# Patient Record
Sex: Male | Born: 1970 | Race: White | Hispanic: No | Smoking: Former smoker
Health system: Southern US, Community
[De-identification: ages and names within clinical notes are randomized; demographics above are authoritative.]

## PROBLEM LIST (undated history)

## (undated) DIAGNOSIS — F329 Major depressive disorder, single episode, unspecified: Secondary | ICD-10-CM

## (undated) DIAGNOSIS — F32A Depression, unspecified: Secondary | ICD-10-CM

## (undated) DIAGNOSIS — F419 Anxiety disorder, unspecified: Secondary | ICD-10-CM

## (undated) DIAGNOSIS — K219 Gastro-esophageal reflux disease without esophagitis: Secondary | ICD-10-CM

## (undated) HISTORY — DX: Anxiety disorder, unspecified: F41.9

## (undated) HISTORY — DX: Gastro-esophageal reflux disease without esophagitis: K21.9

## (undated) HISTORY — DX: Major depressive disorder, single episode, unspecified: F32.9

## (undated) HISTORY — DX: Depression, unspecified: F32.A

---

## 2012-07-18 HISTORY — PX: VASECTOMY: SHX75

## 2013-11-28 ENCOUNTER — Encounter (INDEPENDENT_AMBULATORY_CARE_PROVIDER_SITE_OTHER): Payer: Self-pay

## 2013-11-28 ENCOUNTER — Ambulatory Visit (INDEPENDENT_AMBULATORY_CARE_PROVIDER_SITE_OTHER): Payer: 59 | Admitting: Psychiatry

## 2013-11-28 ENCOUNTER — Encounter (HOSPITAL_COMMUNITY): Payer: Self-pay | Admitting: Psychiatry

## 2013-11-28 VITALS — BP 125/83 | HR 74 | Ht 71.0 in | Wt 200.0 lb

## 2013-11-28 DIAGNOSIS — F321 Major depressive disorder, single episode, moderate: Secondary | ICD-10-CM | POA: Insufficient documentation

## 2013-11-28 DIAGNOSIS — F329 Major depressive disorder, single episode, unspecified: Secondary | ICD-10-CM

## 2013-11-28 MED ORDER — BUPROPION HCL ER (XL) 150 MG PO TB24: 150.0000 mg | ORAL_TABLET | Freq: Every day | ORAL | Status: DC
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
## 2014-01-28 ENCOUNTER — Other Ambulatory Visit (HOSPITAL_COMMUNITY): Payer: Self-pay | Admitting: Psychiatry

## 2014-01-28 ENCOUNTER — Ambulatory Visit (HOSPITAL_COMMUNITY): Payer: 59 | Admitting: Psychiatry

## 2014-01-30 ENCOUNTER — Encounter (HOSPITAL_COMMUNITY): Payer: Self-pay | Admitting: Psychiatry

## 2014-01-30 ENCOUNTER — Ambulatory Visit (HOSPITAL_COMMUNITY): Payer: 59 | Admitting: Psychiatry

## 2014-01-30 ENCOUNTER — Ambulatory Visit (INDEPENDENT_AMBULATORY_CARE_PROVIDER_SITE_OTHER): Payer: 59 | Admitting: Psychiatry

## 2014-01-30 VITALS — BP 116/76 | HR 75 | Ht 71.0 in | Wt 192.8 lb

## 2014-01-30 DIAGNOSIS — F321 Major depressive disorder, single episode, moderate: Secondary | ICD-10-CM

## 2014-01-30 MED ORDER — BUPROPION HCL ER (XL) 150 MG PO TB24: 150.0000 mg | ORAL_TABLET | Freq: Every day | ORAL | Status: DC

## 2014-01-30 NOTE — Progress Notes (Signed)
[facility] [id number] Progress Note  Thomas Kelly [id number] 43 y.o.  01/30/2014 1:37 PM  Chief Complaint: doing well  History of Present Illness: Still has a few moments where he feels down 1-2x/month. Overall he feels better. States it occurs in the morning when he thinking about things he needs to do.  Depression is improved and less intense. Motivation and anhedonia have improved. Denies irritability. Sleep is good for the most part. Appetite, energy and concentration are good.   Anxiety is present but manageable.    Pt takes Wellbutrin as prescribed and denies SE.    School is starting again in late August. Pt is looking forward to it.   Suicidal Ideation: No Plan Formed: No Patient has means to carry out plan: No  Homicidal Ideation: No Plan Formed: No Patient has means to carry out plan: No  Review of Systems: Psychiatric: Agitation: No Hallucination: No Depressed Mood: No Insomnia: No Hypersomnia: No Altered Concentration: No Feels Worthless: No Grandiose Ideas: No Belief In Special Powers: No New/Increased Substance Abuse: No Compulsions: No  Neurologic: Headache: No Seizure: No Paresthesias: No  Past Medical Family, Social History:  Denies current nicotine and drug use. He drinks beer a few times a week. [place]. He has 2 sons who he shares custody with his ex- wife. 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 He enjoys painting, music, poetry and bike riding. Pt endorsing family hx of ADD and Bipolar disorder in his son.   Outpatient Encounter Prescriptions as of 01/30/2014  Medication Sig  . buPROPion (WELLBUTRIN XL) 150 MG 24 hr tablet Take 1 tablet (150 mg total) by mouth daily.  . Fish Oil OIL Take 1 tablet by mouth daily.  . Multiple Vitamin (MULTIVITAMIN) capsule Take 1 capsule by mouth daily.  . Omeprazole 20 MG TBEC Take 20-40 mg by mouth daily.    Past Psychiatric History/Hospitalization(s): Anxiety: No Bipolar  Disorder: No Depression: Yes Mania: No Psychosis: No Schizophrenia: No Personality Disorder: No Hospitalization for psychiatric illness: No History of Electroconvulsive Shock Therapy: No Prior Suicide Attempts: No  Physical Exam: Constitutional:  BP 116/76  Pulse 75  Ht 5\' 11"  (1.803 m)  Wt 192 lb 12.8 oz (87.454 kg)  BMI 26.90 kg/m2  General Appearance: alert, oriented, no acute distress  Musculoskeletal: Strength & Muscle Tone: within normal limits Gait & Station: normal Patient leans: N/A  Mental Status Examination/Evaluation:  Objective: Appearance: fairly groomed, appears to be stated age  Attitude: Calm and cooperative  Eye Contact: Fair   Speech and Language: Clear and Coherent, spontaneous, normal rate  Volume: Normal   Mood: euthymic  Affect: Full Range   Thought Process: Coherent   Attention/Concentration: WNL  Orientation: Full (Time, Place, and Person)   Thought Content: WDL  Suicidal Thoughts: No  Homicidal Thoughts: No   Judgement: Fair   General fund of knowledge: average  Insight: Present   Psychomotor Activity: Normal   Akathisia: No   Handed: Right   AIMS (if indicated): Facial and Oral Movements  Muscles of Facial Expression: None, normal  Lips and Perioral Area: None, normal  Jaw: None, normal  Tongue: None, normal Extremity Movements: Upper (arms, wrists, hands, fingers): None, normal  Lower (legs, knees, ankles, toes): None, normal,  Trunk Movements:  Neck, shoulders, hips: None, normal,  Overall Severity : Severity of abnormal movements (highest score from questions above): None, normal  Incapacitation due to abnormal movements: None, normal  Patient's awareness of abnormal movements (rate  only patient's report): No Awareness, Dental Status  Current problems with teeth and/or dentures?: No  Does patient usually wear dentures?: No       Medical Decision Making (Choose Three): Established Problem, Stable/Improving (1), Review of  Psycho-Social Stressors (1) and Review of Medication Regimen & Side Effects (2)  Assessment: AXIS I  Major Depression, single episode   AXIS II  Deferred   AXIS III  Past Medical History    Diagnosis  Date    .  GERD (gastroesophageal reflux disease)    AXIS IV  occupational problems and other psychosocial or environmental problems   AXIS V  51-60 moderate symptoms     Treatment Plan/Recommendations:  Plan of Care:  Medication management with supportive therapy. Risks/benefits and SE of the medication discussed. Pt verbalized understanding and verbal consent obtained for treatment.  Affirm with the patient that the medications are taken as ordered. Patient expressed understanding of how their medications were to be used.     Laboratory: pt will fax over labs from PCP, done in March 2015   Psychotherapy: Therapy: brief supportive therapy provided. Discussed psychosocial stressors in detail.    continue therapy with psychologist   Medications: Continue Wellbutrin XL 150mg  for mood. Consider folic acid and Sam-E supplements as it may benefit mood   Routine PRN Medications: No   Consultations: none at this time   Safety Concerns: Pt denies SI and is at an acute low risk for suicide. Crisis plan reviewed and discussed. Pt verbalized understanding.   Other: F/up in 3 months or sooner if needed    Oletta Darter, MD 01/30/2014

## 2014-04-25 ENCOUNTER — Other Ambulatory Visit (HOSPITAL_COMMUNITY): Payer: Self-pay | Admitting: Psychiatry

## 2014-05-13 ENCOUNTER — Ambulatory Visit (INDEPENDENT_AMBULATORY_CARE_PROVIDER_SITE_OTHER): Payer: 59 | Admitting: Psychiatry

## 2014-05-13 ENCOUNTER — Encounter (HOSPITAL_COMMUNITY): Payer: Self-pay | Admitting: Psychiatry

## 2014-05-13 VITALS — BP 131/83 | HR 64 | Ht 71.0 in | Wt 191.4 lb

## 2014-05-13 DIAGNOSIS — F321 Major depressive disorder, single episode, moderate: Secondary | ICD-10-CM

## 2014-05-13 MED ORDER — BUPROPION HCL ER (XL) 150 MG PO TB24: 150.0000 mg | ORAL_TABLET | Freq: Every day | ORAL | Status: DC

## 2014-05-13 NOTE — Progress Notes (Signed)
[facility] [id number] Progress Note  Thomas Kelly [id number] 43 y.o.  05/13/2014 11:11 AM  Chief Complaint: feeling stressed  History of Present Illness: Pt just got back from an isolative 2 week research trip. States he usually doesn't handle being alone for long but did well overall.  Depression is significantly improved. Has rare moments of feeling a little down but overall it is managable. One bad episode when he went camping alone for 2 nights after his dog died. He was very depressed and was worried about having SI (he did not have any SI). Pt spent his time writing and thought about his work. Pt denies hopelessness, worthlessness, isolation and anhedonia. Work is going well but he has a lot of stress and is overwhelmed. Denies irritability. Sleep is better but still a little variable. Appetite, energy and concentration are good. He is exercising, using meditation and eating well.   Anxiety is present but moderate and manageable.  Overall he is handling conferences and talks better.   Pt takes Wellbutrin as prescribed and denies SE.    Suicidal Ideation: No Plan Formed: No Patient has means to carry out plan: No  Homicidal Ideation: No Plan Formed: No Patient has means to carry out plan: No  Review of Systems: Psychiatric: Agitation: No Hallucination: No Depressed Mood: No Insomnia: No Hypersomnia: No Altered Concentration: No Feels Worthless: No Grandiose Ideas: No Belief In Special Powers: No New/Increased Substance Abuse: No Compulsions: No  Neurologic: Headache: No Seizure: No Paresthesias: No  Review of Systems  Constitutional: Negative for fever, chills and malaise/fatigue.  HENT: Negative for congestion, nosebleeds and sore throat.   Eyes: Negative for blurred vision and double vision.  Respiratory: Negative for cough and sputum production.   Cardiovascular: Negative for chest pain, palpitations and leg swelling.  Gastrointestinal: Positive for  heartburn. Negative for nausea, vomiting, abdominal pain, diarrhea and constipation.  Musculoskeletal: Negative for back pain, joint pain and neck pain.  Neurological: Negative for dizziness, tremors, sensory change, focal weakness, seizures and headaches.  Psychiatric/Behavioral: Negative for depression, suicidal ideas, hallucinations and substance abuse. The patient is nervous/anxious. The patient does not have insomnia.      Past Medical Family, Social History:  Denies current nicotine and drug use. He drinks beer a few times a week. [place]. He has 2 sons who he shares custody with his ex- wife. 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 Pt endorsing family hx of ADD and Bipolar disorder in his son.  Past Medical History  Diagnosis Date  . GERD (gastroesophageal reflux disease)     Outpatient Encounter Prescriptions as of 05/13/2014  Medication Sig  . buPROPion (WELLBUTRIN XL) 150 MG 24 hr tablet TAKE 1 TABLET ONCE DAILY.  Marland Kitchen. Fish Oil OIL Take 1 tablet by mouth daily.  . Multiple Vitamin (MULTIVITAMIN) capsule Take 1 capsule by mouth daily.  . Omeprazole 20 MG TBEC Take 20-40 mg by mouth daily.    Past Psychiatric History/Hospitalization(s): Anxiety: No Bipolar Disorder: No Depression: Yes Mania: No Psychosis: No Schizophrenia: No Personality Disorder: No Hospitalization for psychiatric illness: No History of Electroconvulsive Shock Therapy: No Prior Suicide Attempts: No  Physical Exam: Constitutional:  BP 131/83  Pulse 64  Ht 5\' 11"  (1.803 m)  Wt 191 lb 6.4 oz (86.818 kg)  BMI 26.71 kg/m2  General Appearance: alert, oriented, no acute distress  Musculoskeletal: Strength & Muscle Tone: within normal limits Gait & Station: normal  Patient leans: N/A  Mental Status Examination/Evaluation: Objective: Attitude: Calm and cooperative  Appearance: Well Groomed, appears to be stated age  Eye  Contact::  Good  Speech:  Clear and Coherent and Normal Rate  Volume:  Normal  Mood:  euthymic  Affect:  Full Range  Thought Process:  Goal Directed, Linear and Logical  Orientation:  Full (Time, Place, and Person)  Thought Content:  Negative  Suicidal Thoughts:  No  Homicidal Thoughts:  No  Judgement:  Good  Insight:  Good  Concentration: good  Memory: Immediate-good Recent-good Remote-good  Recall: fair  Language: fair  Gait and Station: normal  [employer] of Knowledge: average  Psychomotor Activity:  Normal  Akathisia:  No  Handed:  Right  AIMS (if indicated): n/a  Assets:  Manufacturing systems engineerCommunication Skills Desire for Improvement Financial Resources/Insurance Housing Intimacy Leisure Time Physical Health Resilience Social Support Water engineerTalents/Skills Transportation     Medical Decision Making (Choose Three): Established Problem, Stable/Improving (1), Review of Psycho-Social Stressors (1) and Review of Medication Regimen & Side Effects (2)  Assessment: AXIS I  Major Depression, single episode, moderate  AXIS II  Deferred   AXIS III  Past Medical History    Diagnosis  Date    .  GERD (gastroesophageal reflux disease)    AXIS IV  occupational problems and other psychosocial or environmental problems   AXIS V  51-60 moderate symptoms     Treatment Plan/Recommendations:  Plan of Care:   Medication management with supportive therapy. Risks/benefits and SE of the medication discussed. Pt verbalized understanding and verbal consent obtained for treatment.  Affirm with the patient that the medications are taken as ordered. Patient expressed understanding of how their medications were to be used.  - improvement of symptoms       Laboratory: pt will sign MR to get labs from PCP, done in March 2015   Psychotherapy: Therapy: brief supportive therapy provided. Discussed psychosocial stressors in detail.     Medications: Continue Wellbutrin XL 150mg  for mood.  Routine PRN Medications:  No   Consultations: encouraged to continue individual therapy  Safety Concerns: Pt denies SI and is at an acute low risk for suicide.Patient told to call clinic if any problems occur. Patient advised to go to ER if they should develop SI/HI, side effects, or if symptoms worsen. Has crisis numbers to call if needed. Pt verbalized understanding.   Other: F/up in 3 months or sooner if needed    Oletta DarterAGARWAL, Isaic Syler, MD 05/13/2014

## 2014-07-31 ENCOUNTER — Ambulatory Visit (INDEPENDENT_AMBULATORY_CARE_PROVIDER_SITE_OTHER): Payer: 59 | Admitting: Psychiatry

## 2014-07-31 ENCOUNTER — Encounter (HOSPITAL_COMMUNITY): Payer: Self-pay | Admitting: Psychiatry

## 2014-07-31 VITALS — BP 129/81 | HR 69 | Ht 70.0 in | Wt 192.4 lb

## 2014-07-31 DIAGNOSIS — F321 Major depressive disorder, single episode, moderate: Secondary | ICD-10-CM

## 2014-07-31 MED ORDER — BUPROPION HCL ER (XL) 300 MG PO TB24: 300.0000 mg | ORAL_TABLET | Freq: Every day | ORAL | Status: DC

## 2014-07-31 NOTE — Progress Notes (Signed)
[facility] [id number] Progress Note  Thomas Kelly [id number] 44 y.o.  07/31/2014 11:00 AM  Chief Complaint: overall ok  History of Present Illness: Pt is on holiday until Jan 25th. He states it is giving him a lot of time to think.   He writes in a journal more so when stressed. Notes he is more stressed when the semester begins and ends each year.  Depression is present and level is moderate. Holidays are always difficult. Always feels the need to discuss his feelings with others but is working on doing that less. He is working on using new Systems analyst. Sad mood frequency depends on his stress level.  Pt denies hopelessness, worthlessness, isolation and anhedonia. He enjoys socializing but it feels like a chore. He is looking forward to the new semester. Sleep is good. Appetite, energy and concentration are good. He is exercising, using meditation and eating well.   Anxiety is present and he is working.   Pt takes Wellbutrin as prescribed and denies SE.    Continues to work with therapist.   Suicidal Ideation: No Plan Formed: No Patient has means to carry out plan: No  Homicidal Ideation: No Plan Formed: No Patient has means to carry out plan: No  Review of Systems: Psychiatric: Agitation: No Hallucination: No Depressed Mood: Yes Insomnia: No Hypersomnia: No Altered Concentration: No Feels Worthless: No Grandiose Ideas: No Belief In Special Powers: No New/Increased Substance Abuse: No Compulsions: No  Neurologic: Headache: No Seizure: No Paresthesias: No  Review of Systems  Constitutional: Negative for fever, chills and weight loss.  HENT: Negative for ear discharge, nosebleeds and sore throat.   Eyes: Negative for blurred vision, double vision and redness.  Respiratory: Negative for cough, sputum production and shortness of breath.   Cardiovascular: Positive for leg swelling. Negative for chest pain and palpitations.  Gastrointestinal: Positive for  heartburn. Negative for nausea, vomiting and abdominal pain.  Musculoskeletal: Negative for myalgias, back pain and neck pain.  Skin: Negative for itching and rash.  Neurological: Negative for dizziness, tingling, seizures and headaches.  Psychiatric/Behavioral: Positive for depression. Negative for suicidal ideas, hallucinations and substance abuse. The patient is nervous/anxious. The patient does not have insomnia.      Past Medical Family, Social History:  Denies current nicotine and drug use. He drinks beer a few times a week. Pt lives with his girlfriend in [place]. He has 2 sons who he shares custody with his ex- wife. 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 He enjoys painting, music, poetry and bike riding. Pt endorsing family hx of ADD and Bipolar disorder in his son.  Past Medical History  Diagnosis Date  . GERD (gastroesophageal reflux disease)     Outpatient Encounter Prescriptions as of 07/31/2014  Medication Sig  . buPROPion (WELLBUTRIN XL) 150 MG 24 hr tablet Take 1 tablet (150 mg total) by mouth daily.  . Fish Oil OIL Take 1 tablet by mouth daily.  . Multiple Vitamin (MULTIVITAMIN) capsule Take 1 capsule by mouth daily.  . Omeprazole 20 MG TBEC Take 20-40 mg by mouth daily.    Past Psychiatric History/Hospitalization(s): Anxiety: No Bipolar Disorder: No Depression: Yes Mania: No Psychosis: No Schizophrenia: No Personality Disorder: No Hospitalization for psychiatric illness: No History of Electroconvulsive Shock Therapy: No Prior Suicide Attempts: No  Physical Exam: Constitutional:  BP 129/81 mmHg  Pulse 69  Ht  (1.778 m)  Wt 192 lb 6.4 oz (87.272 kg)  BMI 27.61 kg/m2  General Appearance: alert, oriented, no  acute distress  Musculoskeletal: Strength & Muscle Tone: within normal limits Gait & Station: normal Patient leans: N/A  Mental Status Examination/Evaluation: Objective: Attitude: Calm and cooperative  Appearance: Well Groomed, appears to be  stated age  Eye Contact::  Good  Speech:  Clear and Coherent and Normal Rate  Volume:  Normal  Mood:  depressed  Affect:  Congruent  Thought Process:  Goal Directed, Linear and Logical  Orientation:  Full (Time, Place, and Person)  Thought Content:  Negative  Suicidal Thoughts:  No  Homicidal Thoughts:  No  Judgement:  Good  Insight:  Good  Concentration: good  Memory: Immediate-good Recent-good Remote-good  Recall: fair  Language: fair  Gait and Station: normal  [employer] of Knowledge: average  Psychomotor Activity:  Normal  Akathisia:  No  Handed:  Right  AIMS (if indicated): n/a  Assets:  Manufacturing systems engineerCommunication Skills Desire for Improvement Financial Resources/Insurance Housing Intimacy Leisure Time Physical Health Resilience Social Support Water engineerTalents/Skills Transportation     Medical Decision Making (Choose Three): Review of Psycho-Social Stressors (1), Established Problem, Worsening (2), Review of Medication Regimen & Side Effects (2) and Review of New Medication or Change in Dosage (2)  Assessment: AXIS I  Major Depression, single episode, moderate  AXIS II  Deferred   AXIS III  Past Medical History    Diagnosis  Date    .  GERD (gastroesophageal reflux disease)    AXIS IV  occupational problems and other psychosocial or environmental problems   AXIS V  51-60 moderate symptoms     Treatment Plan/Recommendations:  Plan of Care:   Medication management with supportive therapy. Risks/benefits and SE of the medication discussed. Pt verbalized understanding and verbal consent obtained for treatment.  Affirm with the patient that the medications are taken as ordered. Patient expressed understanding of how their medications were to be used.  - worsening of symptoms   Laboratory: pt will sign MR to get labs from PCP  Psychotherapy: Therapy: brief supportive therapy provided. Discussed psychosocial stressors in detail.     Medications: increase Wellbutrin XL to 300mg  for  mood. Advised to consider supplement with Sam-E  Routine PRN Medications: No   Consultations: encouraged to continue individual therapy  Safety Concerns: Pt denies SI and is at an acute low risk for suicide.Patient told to call clinic if any problems occur. Patient advised to go to ER if they should develop SI/HI, side effects, or if symptoms worsen. Has crisis numbers to call if needed. Pt verbalized understanding.   Other: F/up in 3 months or sooner if needed    Oletta DarterAGARWAL, Quan Cybulski, MD 07/31/2014

## 2014-10-28 ENCOUNTER — Encounter (HOSPITAL_COMMUNITY): Payer: Self-pay | Admitting: Psychiatry

## 2014-10-28 ENCOUNTER — Ambulatory Visit (INDEPENDENT_AMBULATORY_CARE_PROVIDER_SITE_OTHER): Payer: 59 | Admitting: Psychiatry

## 2014-10-28 VITALS — BP 129/79 | HR 77 | Ht 70.0 in | Wt 194.6 lb

## 2014-10-28 DIAGNOSIS — F321 Major depressive disorder, single episode, moderate: Secondary | ICD-10-CM

## 2014-10-28 MED ORDER — BUPROPION HCL ER (XL) 300 MG PO TB24: 300.0000 mg | ORAL_TABLET | Freq: Every day | ORAL | Status: DC

## 2014-10-28 NOTE — Progress Notes (Signed)
Patient ID: Thomas Kelly, male   DOB: 03-23-1971, 44 y.o.   MRN: [id number]  [facility] [id number] Progress Note  Thomas Kelly [id number] 44 y.o.  10/28/2014 2:33 PM  Chief Complaint: "doing great"  History of Present Illness: He is feeling good and is happy with his response to Wellbutrin.   He writes in a journal more so when stressed. Notes he is more stressed when the semester begins and ends each year.  Depression is present and level is low. He feels down for a few hours several times a week. He is working on using new Systems analyst. Sad mood frequency depends on his stress level.  Pt denies hopelessness, worthlessness, isolation and anhedonia. He enjoys socializing. Sleep is good. Appetite and energy are good. Concentration is a little poor but he is managing. He is exercising, using meditation and eating well.   Anxiety is improving and he is working. Pt is working in out in therapy.   Pt takes Wellbutrin as prescribed and denies SE.    Continues to work with therapist.   This summer he going to [place] for a trip and taking a road trip.   Suicidal Ideation: No Plan Formed: No Patient has means to carry out plan: No  Homicidal Ideation: No Plan Formed: No Patient has means to carry out plan: No  Review of Systems: Psychiatric: Agitation: No Hallucination: No Depressed Mood: Yes Insomnia: No Hypersomnia: No Altered Concentration: No Feels Worthless: No Grandiose Ideas: No Belief In Special Powers: No New/Increased Substance Abuse: No Compulsions: No  Neurologic: Headache: No Seizure: No Paresthesias: No  Review of Systems  Constitutional: Negative for fever, chills and weight loss.  HENT: Positive for sore throat. Negative for congestion, ear pain and nosebleeds.   Eyes: Negative for blurred vision, double vision, photophobia and pain.  Respiratory: Negative for cough, shortness of breath and wheezing.   Cardiovascular: Negative for chest pain,  palpitations and leg swelling.  Gastrointestinal: Negative for heartburn, nausea, vomiting and abdominal pain.  Musculoskeletal: Negative for back pain, joint pain and neck pain.  Skin: Negative for itching and rash.  Neurological: Negative for dizziness, sensory change, seizures and loss of consciousness.  Psychiatric/Behavioral: Positive for depression. Negative for suicidal ideas, hallucinations and substance abuse. The patient is nervous/anxious. The patient does not have insomnia.      Past Medical Family, Social History:  Denies current nicotine and drug use. He drinks beer a few times a week. Pt lives with his girlfriend in [place]. He has 2 sons who he shares custody with his ex- wife. 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 He enjoys painting, music, poetry and bike riding. Pt endorsing family hx of ADD and Bipolar disorder in his son.  Past Medical History  Diagnosis Date  . GERD (gastroesophageal reflux disease)   . Depression   . Anxiety     Outpatient Encounter Prescriptions as of 10/28/2014  Medication Sig  . buPROPion (WELLBUTRIN XL) 300 MG 24 hr tablet Take 1 tablet (300 mg total) by mouth daily.  . Fish Oil OIL Take 1 tablet by mouth daily.  . Multiple Vitamin (MULTIVITAMIN) capsule Take 1 capsule by mouth daily.  . Omeprazole 20 MG TBEC Take 20-40 mg by mouth daily.    Past Psychiatric History/Hospitalization(s): Anxiety: No Bipolar Disorder: No Depression: Yes Mania: No Psychosis: No Schizophrenia: No Personality Disorder: No Hospitalization for psychiatric illness: No History of Electroconvulsive Shock Therapy: No Prior Suicide Attempts: No  Physical Exam: Constitutional:  BP 129/79 mmHg  Pulse 77  Ht 5\' 10"  (1.778 m)  Wt 194 lb 9.6 oz (88.27 kg)  BMI 27.92 kg/m2  General Appearance: alert, oriented, no acute distress  Musculoskeletal: Strength & Muscle Tone: within normal limits Gait & Station: normal Patient leans: N/A  Mental Status  Examination/Evaluation: Objective: Attitude: Calm and cooperative  Appearance: Well Groomed, appears to be stated age  Eye Contact::  Good  Speech:  Clear and Coherent and Normal Rate  Volume:  Normal  Mood:  Depressed- mild  Affect:  Congruent- appears brighter than at previous appt  Thought Process:  Goal Directed, Linear and Logical  Orientation:  Full (Time, Place, and Person)  Thought Content:  Negative  Suicidal Thoughts:  No  Homicidal Thoughts:  No  Judgement:  Good  Insight:  Good  Concentration: good  Memory: Immediate-good Recent-good Remote-good  Recall: fair  Language: fair  Gait and Station: normal  [employer] of Knowledge: average  Psychomotor Activity:  Normal  Akathisia:  No  Handed:  Right  AIMS (if indicated): n/a  Assets:  Manufacturing systems engineerCommunication Skills Desire for Improvement Financial Resources/Insurance Housing Intimacy Leisure Time Physical Health Resilience Social Support Water engineerTalents/Skills Transportation     Medical Decision Making (Choose Three): Established Problem, Stable/Improving (1), Review of Psycho-Social Stressors (1) and Review of Medication Regimen & Side Effects (2)  Assessment: AXIS I  Major Depression, single episode, moderate  AXIS II  Deferred   AXIS III  Past Medical History    Diagnosis  Date    .  GERD (gastroesophageal reflux disease)    AXIS IV  occupational problems and other psychosocial or environmental problems   AXIS V  51-60 moderate symptoms     Treatment Plan/Recommendations:  Plan of Care:   Medication management with supportive therapy. Risks/benefits and SE of the medication discussed. Pt verbalized understanding and verbal consent obtained for treatment.  Affirm with the patient that the medications are taken as ordered. Patient expressed understanding of how their medications were to be used.  - improvement of symptoms   Laboratory: pt will sign MR to get labs from PCP  Psychotherapy: Therapy: brief supportive  therapy provided. Discussed psychosocial stressors in detail.     Medications: Wellbutrin XL to 300mg  for mood. He was unable to tolerate the Sam-E  Routine PRN Medications: No   Consultations: encouraged to continue individual therapy  Safety Concerns: Pt denies SI and is at an acute low risk for suicide.Patient told to call clinic if any problems occur. Patient advised to go to ER if they should develop SI/HI, side effects, or if symptoms worsen. Has crisis numbers to call if needed. Pt verbalized understanding.   Other: F/up in 6 months or sooner if needed    Oletta DarterAGARWAL, Jenella Craigie, MD 10/28/2014

## 2015-04-30 ENCOUNTER — Ambulatory Visit (HOSPITAL_COMMUNITY): Payer: Self-pay | Admitting: Psychiatry

## 2015-05-14 ENCOUNTER — Other Ambulatory Visit (HOSPITAL_COMMUNITY): Payer: Self-pay | Admitting: Psychiatry

## 2015-05-21 ENCOUNTER — Ambulatory Visit (HOSPITAL_COMMUNITY): Payer: Self-pay | Admitting: Psychiatry

## 2015-05-26 ENCOUNTER — Encounter (HOSPITAL_COMMUNITY): Payer: Self-pay | Admitting: Psychiatry

## 2015-05-26 ENCOUNTER — Ambulatory Visit (INDEPENDENT_AMBULATORY_CARE_PROVIDER_SITE_OTHER): Payer: 59 | Admitting: Psychiatry

## 2015-05-26 VITALS — BP 129/81 | HR 71 | Ht 70.0 in | Wt 201.8 lb

## 2015-05-26 DIAGNOSIS — F320 Major depressive disorder, single episode, mild: Secondary | ICD-10-CM

## 2015-05-26 MED ORDER — BUPROPION HCL ER (XL) 300 MG PO TB24: 300.0000 mg | ORAL_TABLET | Freq: Every day | ORAL | Status: DC

## 2015-05-26 NOTE — Progress Notes (Signed)
[facility] [id number] Progress Note  Thomas DusDamon Kelly [id number] 44 y.o.  05/26/2015 4:18 PM  Chief Complaint: "doing great"  History of Present Illness: He is feeling good and is happy with his response to Wellbutrin. Many times has thought he didn't need it any longer but wants to wait for now.   He writes in a journal more so when stressed. He is doing it less often because it keeps him too much in his head. Notes he is more stressed when the semester begins and ends each year.  Depression is present and level is low. He feels down for a few hours a couple times a month. He is working on using new Systems analystcoping techniques. Sad mood frequency depends on his stress level.  Pt denies hopelessness, worthlessness, crying spells isolation and anhedonia. He enjoys socializing. Work is going well.   Sleep is good. Appetite and energy are good. Concentration is ok.  He is exercising, using meditation and eating well.   Anxiety is improving and he has no concerns. No longer catrosphoping. . Pt is working on it in therapy.   Pt takes Wellbutrin as prescribed and denies SE.    Continues to work with therapist.      Suicidal Ideation: No Plan Formed: No Patient has means to carry out plan: No  Homicidal Ideation: No Plan Formed: No Patient has means to carry out plan: No  Review of Systems: Psychiatric: Agitation: No Hallucination: No Depressed Mood: Yes Insomnia: No Hypersomnia: No Altered Concentration: No Feels Worthless: No Grandiose Ideas: No Belief In Special Powers: No New/Increased Substance Abuse: No Compulsions: No  Neurologic: Headache: No Seizure: No Paresthesias: No  Review of Systems  Constitutional: Negative for fever, chills and weight loss.  HENT: Negative for congestion, ear pain, nosebleeds and sore throat.   Eyes: Negative for blurred vision, double vision, photophobia and pain.  Respiratory: Negative for cough, shortness of breath and wheezing.    Cardiovascular: Negative for chest pain, palpitations and leg swelling.  Gastrointestinal: Negative for heartburn, nausea, vomiting and abdominal pain.  Musculoskeletal: Negative for back pain, joint pain and neck pain.  Skin: Negative for itching and rash.  Neurological: Negative for dizziness, sensory change, seizures and loss of consciousness.  Psychiatric/Behavioral: Positive for depression. Negative for suicidal ideas, hallucinations and substance abuse. The patient is not nervous/anxious and does not have insomnia.      Past Medical Family, Social History:  Denies current nicotine and drug use. He drinks beer a few times a week. [place] lives [place] girlfriend in [place]. He has 2 sons who he shares custody with his ex- wife. 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 Pt endorsing family hx of ADD and Bipolar disorder in his son.  Past Medical History  Diagnosis Date  . GERD (gastroesophageal reflux disease)   . Depression   . Anxiety     Outpatient Encounter Prescriptions as of 05/26/2015  Medication Sig  . buPROPion (WELLBUTRIN XL) 300 MG 24 hr tablet TAKE 1 TABLET ONCE DAILY.  Marland Kitchen. Fish Oil OIL Take 1 tablet by mouth daily.  . Multiple Vitamin (MULTIVITAMIN) capsule Take 1 capsule by mouth daily.  . Omeprazole 20 MG TBEC Take 20-40 mg by mouth daily.   No facility-administered encounter medications on file as of 05/26/2015.    Past Psychiatric History/Hospitalization(s): Anxiety: No Bipolar Disorder: No Depression: Yes Mania: No Psychosis: No Schizophrenia: No Personality Disorder: No Hospitalization for psychiatric illness: No History of  Electroconvulsive Shock Therapy: No Prior Suicide Attempts: No  Physical Exam: Constitutional:  BP 129/81 mmHg  Pulse 71  Ht  (1.778 m)  Wt 201 lb 12.8 oz (91.536 kg)  BMI 28.96 kg/m2  General Appearance: alert, oriented, no acute distress  Musculoskeletal: Strength & Muscle  Tone: within normal limits Gait & Station: normal Patient leans: N/A  Mental Status Examination/Evaluation: Objective: Attitude: Calm and cooperative  Appearance: Well Groomed, appears to be stated age  Eye Contact::  Good  Speech:  Clear and Coherent and Normal Rate  Volume:  Normal  Mood:  euthymic  Affect:  Congruent- appears brighter than at previous appt  Thought Process:  Goal Directed, Linear and Logical  Orientation:  Full (Time, Place, and Person)  Thought Content:  Negative  Suicidal Thoughts:  No  Homicidal Thoughts:  No  Judgement:  Good  Insight:  Good  Concentration: good  Memory: Immediate-good Recent-good Remote-good  Recall: fair  Language: fair  Gait and Station: normal  [employer] of Knowledge: average  Psychomotor Activity:  Normal  Akathisia:  No  Handed:  Right  AIMS (if indicated): n/a  Assets:  Manufacturing systems engineer Desire for Improvement Financial Resources/Insurance Housing Intimacy Leisure Time Physical Health Resilience Social Support Water engineer (Choose Three): Established Problem, Stable/Improving (1), Review of Psycho-Social Stressors (1) and Review of Medication Regimen & Side Effects (2)  Assessment: AXIS I  Major Depression, single episode, moderate  AXIS II  Deferred   AXIS III  Past Medical History    Diagnosis  Date    .  GERD (gastroesophageal reflux disease)    AXIS IV  occupational problems and other psychosocial or environmental problems   AXIS V  51-60 moderate symptoms     Treatment Plan/Recommendations:  Plan of Care:   Medication management with supportive therapy. Risks/benefits and SE of the medication discussed. Pt verbalized understanding and verbal consent obtained for treatment.  Affirm with the patient that the medications are taken as ordered. Patient expressed understanding of how their medications were to be used.  - improvement of symptoms   Laboratory:  none  Psychotherapy: Therapy: brief supportive therapy provided. Discussed psychosocial stressors in detail.     Medications: Wellbutrin XL to  for mood. He was unable to tolerate the Sam-E  Routine PRN Medications: No   Consultations: encouraged to continue individual therapy  Safety Concerns: Pt denies SI and is at an acute low risk for suicide.Patient told to call clinic if any problems occur. Patient advised to go to ER if they should develop SI/HI, side effects, or if symptoms worsen. Has crisis numbers to call if needed. Pt verbalized understanding.   Other: F/up in 4 months or sooner if needed    Oletta Darter, MD 05/26/2015

## 2015-09-24 ENCOUNTER — Ambulatory Visit (INDEPENDENT_AMBULATORY_CARE_PROVIDER_SITE_OTHER): Payer: 59 | Admitting: Psychiatry

## 2015-09-24 ENCOUNTER — Encounter (HOSPITAL_COMMUNITY): Payer: Self-pay | Admitting: Psychiatry

## 2015-09-24 VITALS — BP 122/74 | HR 65 | Ht 70.0 in | Wt 202.0 lb

## 2015-09-24 DIAGNOSIS — F320 Major depressive disorder, single episode, mild: Secondary | ICD-10-CM

## 2015-09-24 MED ORDER — BUPROPION HCL ER (XL) 300 MG PO TB24: 300.0000 mg | ORAL_TABLET | Freq: Every day | ORAL | Status: DC

## 2015-09-24 NOTE — Progress Notes (Signed)
Patient ID: Thomas Kelly, male   DOB: Apr 11, 1971, 45 y.o.   MRN: [id number]  [facility] [id number] Progress Note  Thomas Kelly [id number] [age] y.o.  09/24/2015 4:06 PM  Chief Complaint: "doing great, no change"  History of Present Illness: He is feeling good and is happy with his response to Wellbutrin. No concerns or issues today.   He continues to journal when needed but tries not to do it too much b/c he tends to think about the worst.  Notes he is more stressed when the semester begins and ends each year.  Depression is present and level is very low. He feels down for a few hours a couple times a month.  Sad mood frequency depends on his stress level.  Pt denies hopelessness, worthlessness, crying spells isolation and anhedonia. He enjoys socializing. Work is going well.   Sleep is good. Appetite and energy are good. Concentration is ok.  He is exercising, using meditation and eating well.   Anxiety is improving and he has no concerns. No longer catrosphoping. . Pt is working on it in therapy.   Pt takes Wellbutrin as prescribed and denies SE.    Continues to work with therapist about 1-2x/month.    Suicidal Ideation: No Plan Formed: No Patient has means to carry out plan: No  Homicidal Ideation: No Plan Formed: No Patient has means to carry out plan: No  Review of Systems: Psychiatric: Agitation: No Hallucination: No Depressed Mood: Yes Insomnia: No Hypersomnia: No Altered Concentration: No Feels Worthless: No Grandiose Ideas: No Belief In Special Powers: No New/Increased Substance Abuse: No Compulsions: No  Neurologic: Headache: No Seizure: No Paresthesias: No  Review of Systems  Constitutional: Negative for fever, chills and weight loss.  HENT: Negative for congestion, ear pain, nosebleeds and sore throat.   Eyes: Negative for blurred vision, double vision, photophobia and pain.  Respiratory: Negative for cough, shortness of breath and wheezing.    Cardiovascular: Negative for chest pain, palpitations and leg swelling.  Gastrointestinal: Negative for heartburn, nausea, vomiting and abdominal pain.  Musculoskeletal: Negative for back pain, joint pain and neck pain.  Skin: Negative for itching and rash.  Neurological: Negative for dizziness, sensory change, seizures and loss of consciousness.  Psychiatric/Behavioral: Positive for depression. Negative for suicidal ideas, hallucinations and substance abuse. The patient is not nervous/anxious and does not have insomnia.      Past Medical Family, Social History:  Denies current nicotine and drug use. He drinks beer a few times a week. Pt lives with his girlfriend in [place]. He has 2 sons who he shares custody with his ex- wife. 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 He enjoys painting, music, poetry and bike riding. Pt endorsing family hx of ADD and Bipolar disorder in his son.  Past Medical History  Diagnosis Date  . GERD (gastroesophageal reflux disease)   . Depression   . Anxiety     Outpatient Encounter Prescriptions as of 09/24/2015  Medication Sig  . buPROPion (WELLBUTRIN XL) 300 MG 24 hr tablet Take 1 tablet (300 mg total) by mouth daily.  . Fish Oil OIL Take 1 tablet by mouth daily.  . Multiple Vitamin (MULTIVITAMIN) capsule Take 1 capsule by mouth daily.  . Omeprazole 20 MG TBEC Take 20-40 mg by mouth daily.   No facility-administered encounter medications on file as of 09/24/2015.    Past Psychiatric History/Hospitalization(s): Anxiety: No Bipolar Disorder: No Depression: Yes Mania: No Psychosis: No Schizophrenia: No Personality Disorder: No Hospitalization for psychiatric  illness: No History of Electroconvulsive Shock Therapy: No Prior Suicide Attempts: No  Physical Exam: Constitutional:  BP 122/74 mmHg  Pulse 65  Ht  (1.778 m)  Wt 202 lb (91.627 kg)  BMI 28.98 kg/m2  General Appearance: alert, oriented, no acute distress  Musculoskeletal: Strength &  Muscle Tone: within normal limits Gait & Station: normal Patient leans: straight  Mental Status Examination/Evaluation: Objective: Attitude: Calm and cooperative  Appearance: Well Groomed, appears to be stated age  Eye Contact::  Good  Speech:  Clear and Coherent and Normal Rate  Volume:  Normal  Mood:  euthymic  Affect:  Congruent- appears brighter than at previous appt  Thought Process:  Goal Directed, Linear and Logical  Orientation:  Full (Time, Place, and Person)  Thought Content:  Negative  Suicidal Thoughts:  No  Homicidal Thoughts:  No  Judgement:  Good  Insight:  Good  Concentration: good  Memory: Immediate-good Recent-good Remote-good  Recall: fair  Language: fair  Gait and Station: normal  [employer] of Knowledge: average  Psychomotor Activity:  Normal  Akathisia:  No  Handed:  Right  AIMS (if indicated): n/a  Assets:  Manufacturing systems engineer Desire for Improvement Financial Resources/Insurance Housing Intimacy Leisure Time Physical Health Resilience Social Support Water engineer (Choose Three): Established Problem, Stable/Improving (1), Review of Psycho-Social Stressors (1) and Review of Medication Regimen & Side Effects (2)  Assessment: AXIS I  Major Depression, single episode, moderate  AXIS II  Deferred     Treatment Plan/Recommendations:  Plan of Care:   Medication management with supportive therapy. Risks/benefits and SE of the medication discussed. Pt verbalized understanding and verbal consent obtained for treatment.  Affirm with the patient that the medications are taken as ordered. Patient expressed understanding of how their medications were to be used.     Laboratory: none  Psychotherapy: Therapy: brief supportive therapy provided. Discussed psychosocial stressors in detail.     Medications: Wellbutrin XL to  for mood. He was unable to tolerate the Sam-E  Routine PRN Medications: No    Consultations: encouraged to continue individual therapy  Safety Concerns: Pt denies SI and is at an acute low risk for suicide.Patient told to call clinic if any problems occur. Patient advised to go to ER if they should develop SI/HI, side effects, or if symptoms worsen. Has crisis numbers to call if needed. Pt verbalized understanding.   Other: F/up in 3 months or sooner if needed    Oletta Darter, MD 09/24/2015

## 2015-10-27 DIAGNOSIS — L570 Actinic keratosis: Secondary | ICD-10-CM | POA: Diagnosis not present

## 2015-10-27 DIAGNOSIS — D225 Melanocytic nevi of trunk: Secondary | ICD-10-CM | POA: Diagnosis not present

## 2015-10-27 DIAGNOSIS — X32XXXD Exposure to sunlight, subsequent encounter: Secondary | ICD-10-CM | POA: Diagnosis not present

## 2015-12-29 ENCOUNTER — Encounter (HOSPITAL_COMMUNITY): Payer: Self-pay | Admitting: Psychiatry

## 2015-12-29 ENCOUNTER — Ambulatory Visit (INDEPENDENT_AMBULATORY_CARE_PROVIDER_SITE_OTHER): Payer: BLUE CROSS/BLUE SHIELD | Admitting: Psychiatry

## 2015-12-29 VITALS — BP 116/70 | HR 93 | Ht 70.0 in | Wt 200.6 lb

## 2015-12-29 DIAGNOSIS — F320 Major depressive disorder, single episode, mild: Secondary | ICD-10-CM

## 2015-12-29 MED ORDER — BUPROPION HCL ER (XL) 300 MG PO TB24: 300.0000 mg | ORAL_TABLET | Freq: Every day | ORAL | Status: DC

## 2015-12-29 NOTE — Progress Notes (Signed)
Patient ID: Thomas Kelly, male   DOB: June 16, 1971, 45 y.o.   MRN: [id number] Patient ID: Thomas Kelly, male   DOB: 11-09-1970, 45 y.o.   MRN: [id number]  [facility] [id number] Progress Note  Thomas Kelly [id number] 45 y.o.  12/29/2015 4:11 PM  Chief Complaint: "doing great, no change"  History of Present Illness: He is feeling good and is happy with his response to Wellbutrin. No concerns or issues today.   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 He likes it. States May was not that bad.   He continues to journal when needed but tries not to do it too much b/c he tends to think about the worst.  Notes he is more stressed when the semester begins and ends each year.  Depression is present and level is very low. He feels down for a few hours a couple times a month.  Sad mood frequency depends on his stress level.  Pt denies hopelessness, worthlessness, crying spells isolation and anhedonia. He enjoys socializing.  Sleep is good. Appetite and energy are good. Concentration is ok.  He is exercising, using meditation and eating well.   Anxiety is improving and he has no concerns. No longer catrosphoping. . Pt is working on it in therapy.   Pt takes Wellbutrin as prescribed and denies SE.    Continues to work with therapist about 1-2x/month.    Suicidal Ideation: No Plan Formed: No Patient has means to carry out plan: No  Homicidal Ideation: No Plan Formed: No Patient has means to carry out plan: No  Review of Systems: Psychiatric: Agitation: No Hallucination: No Depressed Mood: Yes Insomnia: No Hypersomnia: No Altered Concentration: No Feels Worthless: No Grandiose Ideas: No Belief In Special Powers: No New/Increased Substance Abuse: No Compulsions: No  Neurologic: Headache: No Seizure: No Paresthesias: No  Review of Systems  Constitutional: Negative for fever, chills and weight loss.  HENT: Negative for congestion, ear pain, nosebleeds and sore throat.    Eyes: Negative for blurred vision, double vision, photophobia and pain.  Respiratory: Negative for cough, shortness of breath and wheezing.   Cardiovascular: Negative for chest pain, palpitations and leg swelling.  Gastrointestinal: Negative for heartburn, nausea, vomiting and abdominal pain.  Musculoskeletal: Negative for back pain, joint pain and neck pain.  Skin: Negative for itching and rash.  Neurological: Negative for dizziness, sensory change, seizures and loss of consciousness.  Psychiatric/Behavioral: Positive for depression. Negative for suicidal ideas, hallucinations and substance abuse. The patient is nervous/anxious. The patient does not have insomnia.      Past Medical Family, Social History:  Denies current nicotine and drug use. He drinks beer a few times a week. Pt lives with his girlfriend in [place]. He has 2 sons who he shares custody with his ex- wife. 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 He enjoys painting, music, poetry and bike riding. Pt endorsing family hx of ADD and Bipolar disorder in his son.  Past Medical History  Diagnosis Date  . GERD (gastroesophageal reflux disease)   . Depression   . Anxiety     Outpatient Encounter Prescriptions as of 12/29/2015  Medication Sig  . buPROPion (WELLBUTRIN XL) 300 MG 24 hr tablet Take 1 tablet (300 mg total) by mouth daily.  . Fish Oil OIL Take 1 tablet by mouth daily.  . Multiple Vitamin (MULTIVITAMIN) capsule Take 1 capsule by mouth daily.  . Omeprazole 20 MG TBEC Take 20-40 mg by mouth daily.   No facility-administered encounter  medications on file as of 12/29/2015.    Past Psychiatric History/Hospitalization(s): Anxiety: No Bipolar Disorder: No Depression: Yes Mania: No Psychosis: No Schizophrenia: No Personality Disorder: No Hospitalization for psychiatric illness: No History of Electroconvulsive Shock Therapy: No Prior Suicide Attempts: No  Physical Exam: Constitutional:  BP 116/70 mmHg  Pulse 93  Ht  5\' 10"  (1.778 m)  Wt 200 lb 9.6 oz (90.992 kg)  BMI 28.78 kg/m2  General Appearance: alert, oriented, no acute distress  Musculoskeletal: Strength & Muscle Tone: within normal limits Gait & Station: normal Patient leans: straight  Mental Status Examination/Evaluation: Objective: Attitude: Calm and cooperative  Appearance: Well Groomed, appears to be stated age  Eye Contact::  Good  Speech:  Clear and Coherent and Normal Rate  Volume:  Normal  Mood:  euthymic  Affect:  Congruent- appears brighter than at previous appt  Thought Process:  Goal Directed, Linear and Logical  Orientation:  Full (Time, Place, and Person)  Thought Content:  Negative  Suicidal Thoughts:  No  Homicidal Thoughts:  No  Judgement:  Good  Insight:  Good  Concentration: good  Memory: Immediate-good Recent-good Remote-good  Recall: fair  Language: fair  Gait and Station: normal  [employer] of Knowledge: average  Psychomotor Activity:  Normal  Akathisia:  No  Handed:  Right  AIMS (if indicated): n/a  Assets:  Manufacturing systems engineerCommunication Skills Desire for Improvement Financial Resources/Insurance Housing Intimacy Leisure Time Physical Health Resilience Social Support Water engineerTalents/Skills Transportation     Medical Decision Making (Choose Three): Established Problem, Stable/Improving (1), Review of Psycho-Social Stressors (1) and Review of Medication Regimen & Side Effects (2)  Assessment: AXIS I  Major Depression, single episode, moderate  AXIS II  Deferred     Treatment Plan/Recommendations:  Plan of Care:   Medication management with supportive therapy. Risks/benefits and SE of the medication discussed. Pt verbalized understanding and verbal consent obtained for treatment.  Affirm with the patient that the medications are taken as ordered. Patient expressed understanding of how their medications were to be used.     Laboratory: none  Psychotherapy: Therapy: brief supportive therapy provided. Discussed  psychosocial stressors in detail.     Medications: Wellbutrin XL to 300mg  for mood.   Routine PRN Medications: No   Consultations: encouraged to continue individual therapy  Safety Concerns: Pt denies SI and is at an acute low risk for suicide.Patient told to call clinic if any problems occur. Patient advised to go to ER if they should develop SI/HI, side effects, or if symptoms worsen. Has crisis numbers to call if needed. Pt verbalized understanding.   Other: F/up in 3 months or sooner if needed    Oletta DarterSalina Seddrick Flax, MD 12/29/2015

## 2015-12-31 DIAGNOSIS — L237 Allergic contact dermatitis due to plants, except food: Secondary | ICD-10-CM | POA: Diagnosis not present

## 2016-03-31 ENCOUNTER — Ambulatory Visit (HOSPITAL_COMMUNITY): Payer: Self-pay | Admitting: Psychiatry

## 2016-04-26 ENCOUNTER — Other Ambulatory Visit (HOSPITAL_COMMUNITY): Payer: Self-pay | Admitting: Psychiatry

## 2016-04-26 DIAGNOSIS — F320 Major depressive disorder, single episode, mild: Secondary | ICD-10-CM

## 2016-06-15 ENCOUNTER — Other Ambulatory Visit (HOSPITAL_COMMUNITY): Payer: Self-pay | Admitting: Psychiatry

## 2016-06-15 DIAGNOSIS — F320 Major depressive disorder, single episode, mild: Secondary | ICD-10-CM

## 2016-06-17 ENCOUNTER — Other Ambulatory Visit (HOSPITAL_COMMUNITY): Payer: Self-pay | Admitting: Psychiatry

## 2016-06-17 DIAGNOSIS — F320 Major depressive disorder, single episode, mild: Secondary | ICD-10-CM

## 2016-06-23 ENCOUNTER — Other Ambulatory Visit (HOSPITAL_COMMUNITY): Payer: Self-pay

## 2016-06-23 DIAGNOSIS — F320 Major depressive disorder, single episode, mild: Secondary | ICD-10-CM

## 2016-06-23 MED ORDER — BUPROPION HCL ER (XL) 300 MG PO TB24: 300.0000 mg | ORAL_TABLET | Freq: Every day | ORAL | 0 refills | Status: DC

## 2016-06-30 ENCOUNTER — Ambulatory Visit (INDEPENDENT_AMBULATORY_CARE_PROVIDER_SITE_OTHER): Payer: BLUE CROSS/BLUE SHIELD | Admitting: Psychiatry

## 2016-06-30 ENCOUNTER — Encounter (HOSPITAL_COMMUNITY): Payer: Self-pay | Admitting: Psychiatry

## 2016-06-30 DIAGNOSIS — F320 Major depressive disorder, single episode, mild: Secondary | ICD-10-CM

## 2016-06-30 DIAGNOSIS — Z79899 Other long term (current) drug therapy: Secondary | ICD-10-CM

## 2016-06-30 MED ORDER — BUPROPION HCL ER (XL) 300 MG PO TB24: 300.0000 mg | ORAL_TABLET | Freq: Every day | ORAL | 3 refills | Status: DC

## 2016-06-30 NOTE — Progress Notes (Signed)
Patient ID: Thomas Kelly, male   DOB: 1971/07/12, 45 y.o.   MRN: [id number] Patient ID: Thomas Kelly, male   DOB: 1971/07/12, 45 y.o.   MRN: [id number]  [facility] [id number] Progress Note  Thomas Kelly [id number] 45 y.o.  06/30/2016 3:38 PM  Chief Complaint: "looking forward to Xmas break"  History of Present Illness: reviewed information below with patient on 06/30/16  and same as previous visits except as noted  He is feeling good feels he is doing better this year than the previous 2 yrs. No concerns or issues today.   He continues to journal when needed but tries not to do it too much b/c he tends to think about the worst.   Depression is present and level is very low. He feels down for a few hours a couple times a month.  Sad mood frequency depends on his stress level.  Pt denies hopelessness, worthlessness, crying spells isolation and anhedonia. He enjoys socializing.  Sleep is good. Appetite and energy are good. Concentration is ok.  He is exercising, using meditation and eating well.   Anxiety is improving and he has no concerns. No longer catastrophizing. Pt is stressed and anxious about work situations. Pt is working on it in therapy.   Pt takes Wellbutrin as prescribed and denies SE.    Continues to work with therapist about 1-2x/month.   Suicidal Ideation: No Plan Formed: No Patient has means to carry out plan: No  Homicidal Ideation: No Plan Formed: No Patient has means to carry out plan: No  Review of Systems: Psychiatric: Agitation: No Hallucination: No Depressed Mood: Yes Insomnia: No Hypersomnia: No Altered Concentration: No Feels Worthless: No Grandiose Ideas: No Belief In Special Powers: No New/Increased Substance Abuse: No Compulsions: No  Neurologic: Headache: No Seizure: No Paresthesias: No  Review of Systems  Musculoskeletal: Negative for back pain, falls, joint pain and neck pain.  Neurological: Negative for dizziness, tremors, seizures,  loss of consciousness and headaches.  Psychiatric/Behavioral: Positive for depression. Negative for hallucinations, substance abuse and suicidal ideas. The patient is nervous/anxious. The patient does not have insomnia.      Past Medical,  Family, Social History:  reviewed information with patient on 06/30/16  and same as previous visits except as noted Denies current nicotine and drug use. He drinks beer a few times a week. Pt lives with [place] girlfriend in [place]. He has 2 sons who he shares custody with his ex- wife. 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 Pt endorsing family hx of ADD and Bipolar disorder in his son.  Past Medical History:  Diagnosis Date  . Anxiety   . Depression   . GERD (gastroesophageal reflux disease)     Outpatient Encounter Prescriptions as of 06/30/2016  Medication Sig  . buPROPion (WELLBUTRIN XL) 300 MG 24 hr tablet Take 1 tablet (300 mg total) by mouth daily.  . Fish Oil OIL Take 1 tablet by mouth daily.  . Multiple Vitamin (MULTIVITAMIN) capsule Take 1 capsule by mouth daily.  . Omeprazole 20 MG TBEC Take 20-40 mg by mouth daily.   No facility-administered encounter medications on file as of 06/30/2016.     Past Psychiatric History/Hospitalization(s): Anxiety: No Bipolar Disorder: No Depression: Yes Mania: No Psychosis: No Schizophrenia: No Personality Disorder: No Hospitalization for psychiatric illness: No History of Electroconvulsive Shock Therapy: No Prior Suicide Attempts: No  Physical Exam: Constitutional:  BP 102/70   Pulse 95   Ht 5'  10.25" (1.784 m)   Wt 199 lb 9.6 oz (90.5 kg)   BMI 28.44 kg/m   General Appearance: alert, oriented, no acute distress  Musculoskeletal: Strength & Muscle Tone: within normal limits Gait & Station: normal Patient leans: straight  Mental Status Examination/Evaluation: reviewed information  on 06/30/16  and same as previous visits except as  noted  Objective: Attitude: Calm and cooperative  Appearance: Well Groomed, appears to be stated age  Eye Contact::  Good  Speech:  Clear and Coherent and Normal Rate  Volume:  Normal  Mood:  euthymic  Affect:  Congruent- appears brighter than at previous appt  Thought Process:  Goal Directed, Linear and Logical  Orientation:  Full (Time, Place, and Person)  Thought Content:  Negative  Suicidal Thoughts:  No  Homicidal Thoughts:  No  Judgement:  Good  Insight:  Good  Concentration: good  Memory: Immediate-good Recent-good Remote-good  Recall: fair  Language: fair  Gait and Station: normal  [employer] of Knowledge: average  Psychomotor Activity:  Normal  Akathisia:  No  Handed:  Right  AIMS (if indicated): n/a  Assets:  Communication Skills Desire for Improvement Financial Resources/Insurance Housing Intimacy Leisure Time Physical Health Resilience Social Support Talents/Skills Transportation    reviewed information below with patient on 06/30/16  and same as previous visits except as noted Assessment: AXIS I  Major Depression, single episode, moderate  AXIS II  Deferred     Treatment Plan/Recommendations:  Plan of Care:   Medication management with supportive therapy. Risks/benefits and SE of the medication discussed. Pt verbalized understanding and verbal consent obtained for treatment.  Affirm with the patient that the medications are taken as ordered. Patient expressed understanding of how their medications were to be used.     Laboratory: none  Psychotherapy: Therapy: brief supportive therapy provided. Discussed psychosocial stressors in detail.     Medications: Wellbutrin XL to 300mg  for mood.   Routine PRN Medications: No   Consultations: encouraged to continue individual therapy  Safety Concerns: Pt denies SI and is at an acute low risk for suicide.Patient told to call clinic if any problems occur. Patient advised to go to ER if they should develop  SI/HI, side effects, or if symptoms worsen. Has crisis numbers to call if needed. Pt verbalized understanding.   Other: F/up in 3 months or sooner if needed    Oletta DarterSalina Ashaunte Standley, MD 06/30/2016

## 2016-10-27 ENCOUNTER — Ambulatory Visit (INDEPENDENT_AMBULATORY_CARE_PROVIDER_SITE_OTHER): Payer: BLUE CROSS/BLUE SHIELD | Admitting: Psychiatry

## 2016-10-27 ENCOUNTER — Encounter (HOSPITAL_COMMUNITY): Payer: Self-pay | Admitting: Psychiatry

## 2016-10-27 DIAGNOSIS — F320 Major depressive disorder, single episode, mild: Secondary | ICD-10-CM | POA: Diagnosis not present

## 2016-10-27 DIAGNOSIS — Z818 Family history of other mental and behavioral disorders: Secondary | ICD-10-CM

## 2016-10-27 DIAGNOSIS — Z87891 Personal history of nicotine dependence: Secondary | ICD-10-CM

## 2016-10-27 DIAGNOSIS — F419 Anxiety disorder, unspecified: Secondary | ICD-10-CM | POA: Diagnosis not present

## 2016-10-27 DIAGNOSIS — Z79899 Other long term (current) drug therapy: Secondary | ICD-10-CM

## 2016-10-27 MED ORDER — BUPROPION HCL ER (XL) 300 MG PO TB24: 300.0000 mg | ORAL_TABLET | Freq: Every day | ORAL | 3 refills | Status: DC

## 2016-10-27 NOTE — Progress Notes (Signed)
BH MD/PA/NP OP Progress Note  10/27/2016 3:16 PM Thomas Kelly  MRN:  [id number]  Chief Complaint:  Chief Complaint    Follow-up      HPI: States he is doing pretty good now.  Anxiety had increased for a while but is now more manageable. He has been exercising more and talking with his therapist. It is working well.  He continues to journal and writing letters to himself.  Pt denies depression. Denies anhedonia, isolation, crying spells, low motivation, poor hygiene, worthlessness and hopelessness. Denies SI/HI.  Sleep is good.   Appetite is fine.   Taking meds as prescribed and denies SE.      Visit Diagnosis:    ICD-9-CM ICD-10-CM   1. Major depressive disorder, single episode, mild (HCC) 296.21 F32.0 buPROPion (WELLBUTRIN XL) 300 MG 24 hr tablet    Past Psychiatric History:  Anxiety: No Bipolar Disorder: No Depression: Yes Mania: No Psychosis: No Schizophrenia: No Personality Disorder: No Hospitalization for psychiatric illness: No History of Electroconvulsive Shock Therapy: No Prior Suicide Attempts: No   Past Medical History:  Past Medical History:  Diagnosis Date  . Anxiety   . Depression   . GERD (gastroesophageal reflux disease)     Past Surgical History:  Procedure Laterality Date  . VASECTOMY  2014    Family Psychiatric and Medical Hx: Family History  Problem Relation Age of Onset  . ADD / ADHD Son   . Bipolar disorder Son   . Alcohol abuse Neg Hx   . Anxiety disorder Neg Hx   . Dementia Neg Hx   . Depression Neg Hx   . Drug abuse Neg Hx   . Schizophrenia Neg Hx   . Suicidality Neg Hx     Social History:  Social History   Social History  . Marital status: Married    Spouse name: N/A  . Number of children: N/A  . Years of education: N/A   Social History Main Topics  . Smoking status: Former Smoker    Quit date: 11/29/2007  . Smokeless tobacco: Never Used  . Alcohol use 3.0 oz/week    5 Cans of beer per week     Comment: a few  times a week  . Drug use: No  . Sexual activity: Not Asked   Other Topics Concern  . None   Social History Narrative  . None    Allergies: No Known Allergies  Metabolic Disorder Labs: No results found for: HGBA1C, MPG No results found for: PROLACTIN No results found for: CHOL, TRIG, HDL, CHOLHDL, VLDL, LDLCALC   Current Medications: Current Outpatient Prescriptions  Medication Sig Dispense Refill  . buPROPion (WELLBUTRIN XL) 300 MG 24 hr tablet Take 1 tablet (300 mg total) by mouth daily. 30 tablet 3  . Fish Oil OIL Take 1 tablet by mouth daily.    . Multiple Vitamin (MULTIVITAMIN) capsule Take 1 capsule by mouth daily.    . Omeprazole 20 MG TBEC Take 20-40 mg by mouth daily.     No current facility-administered medications for this visit.     Musculoskeletal: Strength & Muscle Tone: within normal limits Gait & Station: normal Patient leans: N/A  Psychiatric Specialty Exam: Review of Systems  Gastrointestinal: Negative for abdominal pain, heartburn, nausea and vomiting.  Neurological: Negative for dizziness, tingling, tremors, sensory change and headaches.  Psychiatric/Behavioral: Negative for depression, hallucinations, substance abuse and suicidal ideas. The patient is nervous/anxious. The patient does not have insomnia.     Blood pressure 125/86, pulse  88, weight 198 lb 9.6 oz (90.1 kg).Body mass index is 28.29 kg/m.  General Appearance: Fairly Groomed  Eye Contact:  Good  Speech:  Clear and Coherent and Normal Rate  Volume:  Normal  Mood:  Euthymic  Affect:  Full Range  Thought Process:  Goal Directed and Descriptions of Associations: Intact  Orientation:  Full (Time, Place, and Person)  Thought Content: Logical   Suicidal Thoughts:  No  Homicidal Thoughts:  No  Memory:  Immediate;   Good Recent;   Good Remote;   Good  Judgement:  Good  Insight:  Good  Psychomotor Activity:  Normal  Concentration:  Concentration: Good and Attention Span: Good  Recall:   Good  Fund of Knowledge: Good  Language: Good  Akathisia:  No  Handed:  Right  AIMS (if indicated):  Na/  Assets:  Communication Skills Desire for Improvement Social Support  ADL's:  Intact  Cognition: WNL  Sleep:  good     Treatment Plan Summary:Medication management  Assessment: MDD- recurrent, mild with anxiety   Medication management with supportive therapy. Risks/benefits and SE of the medication discussed. Pt verbalized understanding and verbal consent obtained for treatment.  Affirm with the patient that the medications are taken as ordered. Patient expressed understanding of how their medications were to be used.    Meds: continue Wellbutrin XL  po qD for depression   Labs: none   Therapy: brief supportive therapy provided. Discussed psychosocial stressors in detail.     Consultations:  Encouraged to continue individual therapy  Pt denies SI and is at an acute low risk for suicide. Patient told to call clinic if any problems occur. Patient advised to go to ER if they should develop SI/HI, side effects, or if symptoms worsen. Has crisis numbers to call if needed. Pt verbalized understanding.  F/up in 2 months or sooner if needed   Oletta Darter, MD 10/27/2016, 3:16 PM

## 2016-10-28 DIAGNOSIS — Z0000 Encounter for general adult medical examination without abnormal findings: Secondary | ICD-10-CM | POA: Diagnosis not present

## 2016-11-03 DIAGNOSIS — E7800 Pure hypercholesterolemia, unspecified: Secondary | ICD-10-CM | POA: Diagnosis not present

## 2016-11-03 DIAGNOSIS — Z0000 Encounter for general adult medical examination without abnormal findings: Secondary | ICD-10-CM | POA: Diagnosis not present

## 2016-12-29 ENCOUNTER — Ambulatory Visit (INDEPENDENT_AMBULATORY_CARE_PROVIDER_SITE_OTHER): Payer: BLUE CROSS/BLUE SHIELD | Admitting: Psychiatry

## 2016-12-29 ENCOUNTER — Encounter (HOSPITAL_COMMUNITY): Payer: Self-pay | Admitting: Psychiatry

## 2016-12-29 VITALS — BP 126/78 | HR 86 | Ht 70.0 in | Wt 193.8 lb

## 2016-12-29 DIAGNOSIS — F419 Anxiety disorder, unspecified: Secondary | ICD-10-CM

## 2016-12-29 DIAGNOSIS — F330 Major depressive disorder, recurrent, mild: Secondary | ICD-10-CM

## 2016-12-29 DIAGNOSIS — F411 Generalized anxiety disorder: Secondary | ICD-10-CM

## 2016-12-29 DIAGNOSIS — Z818 Family history of other mental and behavioral disorders: Secondary | ICD-10-CM | POA: Diagnosis not present

## 2016-12-29 DIAGNOSIS — Z87891 Personal history of nicotine dependence: Secondary | ICD-10-CM | POA: Diagnosis not present

## 2016-12-29 DIAGNOSIS — Z79899 Other long term (current) drug therapy: Secondary | ICD-10-CM

## 2016-12-29 DIAGNOSIS — F320 Major depressive disorder, single episode, mild: Secondary | ICD-10-CM

## 2016-12-29 MED ORDER — ALPRAZOLAM 0.5 MG PO TABS: 0.5000 mg | ORAL_TABLET | Freq: Every day | ORAL | 2 refills | Status: DC | PRN

## 2016-12-29 MED ORDER — BUPROPION HCL ER (XL) 300 MG PO TB24: 300.0000 mg | ORAL_TABLET | Freq: Every day | ORAL | 3 refills | Status: DC

## 2016-12-29 NOTE — Progress Notes (Signed)
BH MD/PA/NP OP Progress Note  12/29/2016 9:55 AM Thomas Kelly  MRN:  [id number]  Chief Complaint:  Chief Complaint    Follow-up     HPI: Pt is now on summer vacation. He went to [place] for one week in May. Prior to leaving he was experiencing insomnia. His doctor called in a script for 15 tabs of Xanax. He has used it sparingly and still has a few tabs left. Anxiety continues on a daily basis. In some cases has been debilitating. At those times he can not do much. It triggers catastrophising and numbness in his elbows. He feels it is related to his current relationship. He does not want long term medication for it but it is asking for Xanax. He is doing meditation and it helps. He denies panic attacks.   Sleep is better now. Energy and appetite are good.  Anxiety has "washedout" his depression. At times he feels down and lethargic but he thinks it is stress related. He occasionally feels hopeless and "unloveable".  He denies SI/HI.  Taking meds as prescribed and denies SE.   Visit Diagnosis:    ICD-10-CM   1. Anxiety state F41.1 ALPRAZolam (XANAX) 0.5 MG tablet  2. Major depressive disorder, single episode, mild (HCC) F32.0 buPROPion (WELLBUTRIN XL) 300 MG 24 hr tablet      Past Psychiatric History:  Anxiety:No Bipolar Disorder:No Depression:Yes Mania:No Psychosis:No Schizophrenia:No Personality Disorder:No Hospitalization for psychiatric illness:No History of Electroconvulsive Shock Therapy:No Prior Suicide Attempts:No  Past Medical History:  Past Medical History:  Diagnosis Date  . Anxiety   . Depression   . GERD (gastroesophageal reflux disease)     Past Surgical History:  Procedure Laterality Date  . VASECTOMY  2014    Family Psychiatric History: Family History  Problem Relation Age of Onset  . ADD / ADHD Son   . Bipolar disorder Son   . Alcohol abuse Neg Hx   . Anxiety disorder Neg Hx   . Dementia Neg Hx   . Depression Neg Hx   . Drug abuse Neg  Hx   . Schizophrenia Neg Hx   . Suicidality Neg Hx     Social History:  Social History   Social History  . Marital status: Married    Spouse name: N/A  . Number of children: N/A  . Years of education: N/A   Social History Main Topics  . Smoking status: Former Smoker    Quit date: 11/29/2007  . Smokeless tobacco: Never Used  . Alcohol use 3.0 oz/week    5 Cans of beer per week     Comment: a few times a week  . Drug use: No  . Sexual activity: Not Asked   Other Topics Concern  . None   Social History Narrative  . None    Allergies: No Known Allergies  Metabolic Disorder Labs: No results found for: HGBA1C, MPG No results found for: PROLACTIN No results found for: CHOL, TRIG, HDL, CHOLHDL, VLDL, LDLCALC   Current Medications: Current Outpatient Prescriptions  Medication Sig Dispense Refill  . ALPRAZolam (XANAX) 0.5 MG tablet Take 0.5 mg by mouth as needed.  0  . buPROPion (WELLBUTRIN XL) 300 MG 24 hr tablet Take 1 tablet (300 mg total) by mouth daily. 30 tablet 3  . Fish Oil OIL Take 1 tablet by mouth daily.    . Multiple Vitamin (MULTIVITAMIN) capsule Take 1 capsule by mouth daily.    . Omeprazole 20 MG TBEC Take 20-40 mg by mouth daily.  No current facility-administered medications for this visit.       Musculoskeletal: Strength & Muscle Tone: within normal limits Gait & Station: normal Patient leans: N/A  Psychiatric Specialty Exam: Review of Systems  Musculoskeletal: Negative for back pain, joint pain and neck pain.  Neurological: Positive for sensory change. Negative for dizziness, speech change and headaches.  Psychiatric/Behavioral: Negative for hallucinations, substance abuse and suicidal ideas. The patient is nervous/anxious. The patient does not have insomnia.     Blood pressure 126/78, pulse 86, height 5\' 10"  (1.778 m), weight 193 lb 12.8 oz (87.9 kg).Body mass index is 27.81 kg/m.  General Appearance: Fairly Groomed  Eye Contact:  Good   Speech:  Clear and Coherent and Normal Rate  Volume:  Normal  Mood:  Anxious  Affect:  Appropriate and Congruent  Thought Process:  Coherent and Descriptions of Associations: Circumstantial  Orientation:  Full (Time, Place, and Person)  Thought Content: WDL   Suicidal Thoughts:  No  Homicidal Thoughts:  No  Memory:  Immediate;   Good Recent;   Good Remote;   Good  Judgement:  Good  Insight:  Good  Psychomotor Activity:  Normal  Concentration:  Concentration: Good and Attention Span: Good  Recall:  Good  Fund of Knowledge: Good  Language: Good  Akathisia:  No  Handed:  Right  AIMS (if indicated):  n/a  Assets:  Communication Skills Desire for Improvement Housing Intimacy Leisure Time Physical Health Resilience Social Support Talents/Skills Transportation Vocational/Educational  ADL's:  Intact  Cognition: WNL  Sleep:  good     Treatment Plan Summary:Medication management   Assessment: MDD-recurrent, mild with anxiety   Medication management with supportive therapy. Risks/benefits and SE of the medication discussed. Pt verbalized understanding and verbal consent obtained for treatment.  Affirm with the patient that the medications are taken as ordered. Patient expressed understanding of how their medications were to be used.   Meds: Wellbutrin XL 300mg  po qD for depression Start trial of Xanax 0.5mg  po qD prn anxiety  Labs: none  Therapy: brief supportive therapy provided. Discussed psychosocial stressors in detail.    Consultations:  Encouraged to continue individual therapy  Pt denies SI and is at an acute low risk for suicide. Patient told to call clinic if any problems occur. Patient advised to go to ER if they should develop SI/HI, side effects, or if symptoms worsen. Has crisis numbers to call if needed. Pt verbalized understanding.  F/up in 2 months or sooner if needed    Oletta Darter, MD 12/29/2016, 9:55 AM

## 2017-01-06 DIAGNOSIS — J029 Acute pharyngitis, unspecified: Secondary | ICD-10-CM | POA: Diagnosis not present

## 2017-02-13 DIAGNOSIS — F4320 Adjustment disorder, unspecified: Secondary | ICD-10-CM | POA: Diagnosis not present

## 2017-02-27 DIAGNOSIS — F411 Generalized anxiety disorder: Secondary | ICD-10-CM | POA: Diagnosis not present

## 2017-03-14 DIAGNOSIS — F411 Generalized anxiety disorder: Secondary | ICD-10-CM | POA: Diagnosis not present

## 2017-03-16 ENCOUNTER — Ambulatory Visit (INDEPENDENT_AMBULATORY_CARE_PROVIDER_SITE_OTHER): Payer: BLUE CROSS/BLUE SHIELD | Admitting: Psychiatry

## 2017-03-16 ENCOUNTER — Encounter (HOSPITAL_COMMUNITY): Payer: Self-pay | Admitting: Psychiatry

## 2017-03-16 DIAGNOSIS — Z87891 Personal history of nicotine dependence: Secondary | ICD-10-CM

## 2017-03-16 DIAGNOSIS — F320 Major depressive disorder, single episode, mild: Secondary | ICD-10-CM | POA: Diagnosis not present

## 2017-03-16 DIAGNOSIS — Z818 Family history of other mental and behavioral disorders: Secondary | ICD-10-CM | POA: Diagnosis not present

## 2017-03-16 DIAGNOSIS — F419 Anxiety disorder, unspecified: Secondary | ICD-10-CM

## 2017-03-16 MED ORDER — BUPROPION HCL ER (XL) 300 MG PO TB24: 300.0000 mg | ORAL_TABLET | Freq: Every day | ORAL | 3 refills | Status: DC

## 2017-03-16 NOTE — Progress Notes (Signed)
BH MD/PA/NP OP Progress Note  03/16/2017 9:11 AM Thomas Kelly  MRN:  [id number]  Chief Complaint:  Chief Complaint    Follow-up     HPI: Pt states his anxiety is mild and tolerabe. He has only taken Xanax once in the last few months.   School has started again and he likes being busy. His older son started college and pt is dealing with it. Her younger son is doing well in high school.  Pt and his partner have started therapy together and it helps.  Pt reports his depression is better. There are times he feels depressed but it is mostly situational. Pt denies anhedonia and hopelessness and worthlessness. Pt is keeping busy with reading, writing and exercising. Sleep, appetite and energy are good. Pt denies SI/HI.  Taking meds as prescribed and denies SE.   Visit Diagnosis:    ICD-10-CM   1. Major depressive disorder, single episode, mild (HCC) F32.0 buPROPion (WELLBUTRIN XL) 300 MG 24 hr tablet      Past Psychiatric History:  Bipolar Disorder:No Depression:Yes Mania:No Psychosis:No Schizophrenia:No Personality Disorder:No Hospitalization for psychiatric illness:No History of Electroconvulsive Shock Therapy:No Prior Suicide Attempts:No  Past Medical History:  Past Medical History:  Diagnosis Date  . Anxiety   . Depression   . GERD (gastroesophageal reflux disease)     Past Surgical History:  Procedure Laterality Date  . VASECTOMY  2014    Family Psychiatric History:  Family History  Problem Relation Age of Onset  . ADD / ADHD Son   . Bipolar disorder Son   . Alcohol abuse Neg Hx   . Anxiety disorder Neg Hx   . Dementia Neg Hx   . Depression Neg Hx   . Drug abuse Neg Hx   . Schizophrenia Neg Hx   . Suicidality Neg Hx     Social History:  Social History   Social History  . Marital status: Married    Spouse name: N/A  . Number of children: N/A  . Years of education: N/A   Social History Main Topics  . Smoking status: Former Smoker    Quit  date: 11/29/2007  . Smokeless tobacco: Never Used  . Alcohol use 3.0 oz/week    5 Cans of beer per week     Comment: a few times a week  . Drug use: No  . Sexual activity: Not on file   Other Topics Concern  . Not on file   Social History Narrative  . No narrative on file    Allergies: No Known Allergies  Metabolic Disorder Labs: No results found for: HGBA1C, MPG No results found for: PROLACTIN No results found for: CHOL, TRIG, HDL, CHOLHDL, VLDL, LDLCALC No results found for: TSH  Therapeutic Level Labs: No results found for: LITHIUM No results found for: VALPROATE No components found for:  CBMZ  Current Medications: Current Outpatient Prescriptions  Medication Sig Dispense Refill  . ALPRAZolam (XANAX) 0.5 MG tablet Take 1 tablet (0.5 mg total) by mouth daily as needed. 15 tablet 2  . buPROPion (WELLBUTRIN XL) 300 MG 24 hr tablet Take 1 tablet (300 mg total) by mouth daily. 30 tablet 3  . Fish Oil OIL Take 1 tablet by mouth daily.    . Multiple Vitamin (MULTIVITAMIN) capsule Take 1 capsule by mouth daily.    . Omeprazole 20 MG TBEC Take 20-40 mg by mouth daily.     No current facility-administered medications for this visit.      Musculoskeletal: Strength &  Muscle Tone: within normal limits Gait & Station: normal Patient leans: N/A  Psychiatric Specialty Exam: Review of Systems  Neurological: Negative for dizziness, tremors, sensory change and headaches.  Psychiatric/Behavioral: Positive for depression. Negative for hallucinations, substance abuse and suicidal ideas. The patient is nervous/anxious. The patient does not have insomnia.     There were no vitals taken for this visit.There is no height or weight on file to calculate BMI.  General Appearance: Fairly Groomed  Eye Contact:  Good  Speech:  Clear and Coherent and Normal Rate  Volume:  Normal  Mood:  Euthymic  Affect:  Congruent  Thought Process:  Goal Directed and Descriptions of Associations: Intact   Orientation:  Full (Time, Place, and Person)  Thought Content: Logical   Suicidal Thoughts:  No  Homicidal Thoughts:  No  Memory:  Immediate;   Good Recent;   Good Remote;   Good  Judgement:  Good  Insight:  Good  Psychomotor Activity:  Normal  Concentration:  Concentration: Good and Attention Span: Good  Recall:  Good  Fund of Knowledge: Good  Language: Good  Akathisia:  No  Handed:  Right  AIMS (if indicated): not done  Assets:  Communication Skills Desire for Improvement Financial Resources/Insurance Housing Intimacy Leisure Time Physical Health Resilience Social Support Talents/Skills Transportation Vocational/Educational  ADL's:  Intact  Cognition: WNL  Sleep:  Good     Assessment and Plan: MDD-recurrent, mild with anxiety   Medication management with supportive therapy. Risks/benefits and SE of the medication discussed. Pt verbalized understanding and verbal consent obtained for treatment.  Affirm with the patient that the medications are taken as ordered. Patient expressed understanding of how their medications were to be used.   Meds: Wellbutrin XL 300mg  po qD for depression Xanax 0.5mg  po qD prn anxiety- no script given today   Labs: none  Therapy: brief supportive therapy provided. Discussed psychosocial stressors in detail.     Consultations: encouraged to follow up with therapist -encouraged to follow up with PCP as needed  Pt denies SI and is at an acute low risk for suicide. Patient told to call clinic if any problems occur. Patient advised to go to ER if they should develop SI/HI, side effects, or if symptoms worsen. Has crisis numbers to call if needed. Pt verbalized understanding.  F/up in 3 months or sooner if needed   Oletta Darter, MD 03/16/2017, 9:11 AM

## 2017-03-28 DIAGNOSIS — F411 Generalized anxiety disorder: Secondary | ICD-10-CM | POA: Diagnosis not present

## 2017-04-04 DIAGNOSIS — F411 Generalized anxiety disorder: Secondary | ICD-10-CM | POA: Diagnosis not present

## 2017-04-18 DIAGNOSIS — F411 Generalized anxiety disorder: Secondary | ICD-10-CM | POA: Diagnosis not present

## 2017-04-28 DIAGNOSIS — Z23 Encounter for immunization: Secondary | ICD-10-CM | POA: Diagnosis not present

## 2017-05-09 DIAGNOSIS — F411 Generalized anxiety disorder: Secondary | ICD-10-CM | POA: Diagnosis not present

## 2017-05-23 DIAGNOSIS — F411 Generalized anxiety disorder: Secondary | ICD-10-CM | POA: Diagnosis not present

## 2017-06-16 DIAGNOSIS — F411 Generalized anxiety disorder: Secondary | ICD-10-CM | POA: Diagnosis not present

## 2017-06-22 ENCOUNTER — Other Ambulatory Visit: Payer: Self-pay

## 2017-06-22 ENCOUNTER — Ambulatory Visit (HOSPITAL_COMMUNITY): Payer: BLUE CROSS/BLUE SHIELD | Admitting: Psychiatry

## 2017-06-22 ENCOUNTER — Encounter (HOSPITAL_COMMUNITY): Payer: Self-pay | Admitting: Psychiatry

## 2017-06-22 DIAGNOSIS — Z87891 Personal history of nicotine dependence: Secondary | ICD-10-CM

## 2017-06-22 DIAGNOSIS — F320 Major depressive disorder, single episode, mild: Secondary | ICD-10-CM

## 2017-06-22 DIAGNOSIS — F419 Anxiety disorder, unspecified: Secondary | ICD-10-CM | POA: Diagnosis not present

## 2017-06-22 DIAGNOSIS — Z818 Family history of other mental and behavioral disorders: Secondary | ICD-10-CM | POA: Diagnosis not present

## 2017-06-22 MED ORDER — BUPROPION HCL ER (XL) 300 MG PO TB24
300.0000 mg | ORAL_TABLET | Freq: Every day | ORAL | 3 refills | Status: DC
Start: 2017-06-22 — End: 2017-09-21

## 2017-06-22 NOTE — Progress Notes (Signed)
BH MD/PA/NP OP Progress Note  06/22/2017 9:22 AM Thomas Kelly  MRN:  [id number]  Chief Complaint:  Chief Complaint    Follow-up     HPI: Patient states overall he is doing well.  He has had some stress related to taking emergency custody of his 47 year old son.  He is feeling a little bit anxious about what his ex wife's reaction is going to be.  He spent some time with his girlfriends extended family over Thanksgiving and it was a somewhat difficult time.  Patient feels that his depression while present is decreased as compared to previous years and seems to be more situational.  He has occasional crying spells but denies anhedonia and isolation.  The patient denies suicidal and homicidal ideations.  He is compliant with his medications and denies side effects.  Visit Diagnosis:    ICD-10-CM   1. Major depressive disorder, single episode, mild (HCC) F32.0 buPROPion (WELLBUTRIN XL) 300 MG 24 hr tablet    Past Psychiatric History:  Bipolar Disorder: No Depression: Yes Mania: No Psychosis: No Schizophrenia: No Personality Disorder: No Hospitalization for psychiatric illness: No History of Electroconvulsive Shock Therapy: No Prior Suicide Attempts: No   Past Medical History:  Past Medical History:  Diagnosis Date  . Anxiety   . Depression   . GERD (gastroesophageal reflux disease)     Past Surgical History:  Procedure Laterality Date  . VASECTOMY  2014    Family Psychiatric History:  Family History  Problem Relation Age of Onset  . ADD / ADHD Son   . Bipolar disorder Son   . Alcohol abuse Neg Hx   . Anxiety disorder Neg Hx   . Dementia Neg Hx   . Depression Neg Hx   . Drug abuse Neg Hx   . Schizophrenia Neg Hx   . Suicidality Neg Hx     Social History:  Social History   Socioeconomic History  . Marital status: Married    Spouse name: None  . Number of children: None  . Years of education: None  . Highest education level: None  Social Needs  . Financial  resource strain: None  . Food insecurity - worry: None  . Food insecurity - inability: None  . Transportation needs - medical: None  . Transportation needs - non-medical: None  Occupational History  . None  Tobacco Use  . Smoking status: Former Smoker    Last attempt to quit: 11/29/2007    Years since quitting: 9.5  . Smokeless tobacco: Never Used  Substance and Sexual Activity  . Alcohol use: Yes    Alcohol/week: 3.0 oz    Types: 5 Cans of beer per week    Comment: a few times a week  . Drug use: No  . Sexual activity: None  Other Topics Concern  . None  Social History Narrative  . None    Allergies: No Known Allergies  Metabolic Disorder Labs: No results found for: HGBA1C, MPG No results found for: PROLACTIN No results found for: CHOL, TRIG, HDL, CHOLHDL, VLDL, LDLCALC No results found for: TSH  Therapeutic Level Labs: No results found for: LITHIUM No results found for: VALPROATE No components found for:  CBMZ  Current Medications: Current Outpatient Medications  Medication Sig Dispense Refill  . ALPRAZolam (XANAX) 0.5 MG tablet Take 1 tablet (0.5 mg total) by mouth daily as needed. (Patient not taking: Reported on 03/16/2017) 15 tablet 2  . buPROPion (WELLBUTRIN XL) 300 MG 24 hr tablet Take 1 tablet (300  mg total) by mouth daily. 30 tablet 3  . Fish Oil OIL Take 1 tablet by mouth daily.    . Multiple Vitamin (MULTIVITAMIN) capsule Take 1 capsule by mouth daily.    . Omeprazole 20 MG TBEC Take 20-40 mg by mouth daily.     No current facility-administered medications for this visit.      Musculoskeletal: Strength & Muscle Tone: within normal limits Gait & Station: normal Patient leans: N/A  Psychiatric Specialty Exam: Review of Systems  Constitutional: Negative for chills, fever and weight loss.  HENT: Negative for congestion, nosebleeds, sinus pain and sore throat.   Neurological: Negative for weakness.    Blood pressure 127/79, pulse 80, temperature 97.9  F (36.6 C), temperature source Oral, resp. rate 17, height 5\' 10"  (1.778 m), weight 207 lb 3.2 oz (94 kg), SpO2 98 %.Body mass index is 29.73 kg/m.  General Appearance: Fairly Groomed  Eye Contact:  Good  Speech:  Clear and Coherent and Normal Rate  Volume:  Normal  Mood:  Euthymic  Affect:  Full Range  Thought Process:  Goal Directed and Descriptions of Associations: Intact  Orientation:  Full (Time, Place, and Person)  Thought Content: Logical   Suicidal Thoughts:  No  Homicidal Thoughts:  No  Memory:  Immediate;   Good Recent;   Good Remote;   Good  Judgement:  Good  Insight:  Good  Psychomotor Activity:  Normal  Concentration:  Concentration: Good and Attention Span: Good  Recall:  Good  Fund of Knowledge: Good  Language: Good  Akathisia:  No  Handed:  Right  AIMS (if indicated): not done  Assets:  Communication Skills Desire for Improvement Financial Resources/Insurance Housing Intimacy Leisure Time Resilience Social Support Talents/Skills Transportation Vocational/Educational  ADL's:  Intact  Cognition: WNL  Sleep:  Good   Screenings:   Assessment and Plan: MDD-recurrent, mild with anxiety    Medication management with supportive therapy. Risks/benefits and SE of the medication discussed. Pt verbalized understanding and verbal consent obtained for treatment.  Affirm with the patient that the medications are taken as ordered. Patient expressed understanding of how their medications were to be used.   Meds: Wellbutrin XL 300mg  po qD for depression Xanax 0.5mg  po qD prn anxiety- he rarely takes it   Labs: none  Therapy: brief supportive therapy provided. Discussed psychosocial stressors in detail.     Consultations: Encouraged to follow up with therapist Encouraged to follow up with PCP as needed  Pt denies SI and is at an acute low risk for suicide. Patient told to call clinic if any problems occur. Patient advised to go to ER if they should develop  SI/HI, side effects, or if symptoms worsen. Has crisis numbers to call if needed. Pt verbalized understanding.  F/up in 3 months or sooner if needed    Oletta DarterSalina Anitria Andon, MD 06/22/2017, 9:22 AM

## 2017-07-04 DIAGNOSIS — F411 Generalized anxiety disorder: Secondary | ICD-10-CM | POA: Diagnosis not present

## 2017-07-28 DIAGNOSIS — F411 Generalized anxiety disorder: Secondary | ICD-10-CM | POA: Diagnosis not present

## 2017-09-19 DIAGNOSIS — X32XXXD Exposure to sunlight, subsequent encounter: Secondary | ICD-10-CM | POA: Diagnosis not present

## 2017-09-19 DIAGNOSIS — L570 Actinic keratosis: Secondary | ICD-10-CM | POA: Diagnosis not present

## 2017-09-19 DIAGNOSIS — L720 Epidermal cyst: Secondary | ICD-10-CM | POA: Diagnosis not present

## 2017-09-21 ENCOUNTER — Encounter (HOSPITAL_COMMUNITY): Payer: Self-pay | Admitting: Psychiatry

## 2017-09-21 ENCOUNTER — Ambulatory Visit (HOSPITAL_COMMUNITY): Payer: BLUE CROSS/BLUE SHIELD | Admitting: Psychiatry

## 2017-09-21 DIAGNOSIS — F411 Generalized anxiety disorder: Secondary | ICD-10-CM | POA: Diagnosis not present

## 2017-09-21 DIAGNOSIS — F320 Major depressive disorder, single episode, mild: Secondary | ICD-10-CM

## 2017-09-21 DIAGNOSIS — F1099 Alcohol use, unspecified with unspecified alcohol-induced disorder: Secondary | ICD-10-CM

## 2017-09-21 DIAGNOSIS — Z818 Family history of other mental and behavioral disorders: Secondary | ICD-10-CM

## 2017-09-21 DIAGNOSIS — Z87891 Personal history of nicotine dependence: Secondary | ICD-10-CM

## 2017-09-21 MED ORDER — ALPRAZOLAM 0.5 MG PO TABS: 0.5000 mg | ORAL_TABLET | Freq: Every day | ORAL | 2 refills | Status: DC | PRN

## 2017-09-21 MED ORDER — BUPROPION HCL ER (XL) 300 MG PO TB24: 300.0000 mg | ORAL_TABLET | Freq: Every day | ORAL | 3 refills | Status: DC

## 2017-09-21 NOTE — Progress Notes (Signed)
BH MD/PA/NP OP Progress Note  09/21/2017 1:17 PM Shervin Cypert  MRN:  [id number]  Chief Complaint:  Chief Complaint    Depression; Follow-up     HPI: Patient reports overall he is doing well.  He has a number of stressors going on in his personal life as well as ongoing stressors with work.  He feels like he is handling things well.  Mood does sometimes go down when he is feeling overwhelmed.  Akhil denies isolation anhedonia worthlessness and hopelessness.  He denies SI/HI.   Anxiety is overall well controlled as long as he keeps himself distracted.  He uses a number of techniques including writing, going to the hot tub and socializing which tend to work pretty well for him.  When he is unable to control his anxiety after using these techniques he will take his Xanax.  Pt states-taking meds as prescribed and denies SE. he continues to engage in therapy and states is very helpful.  Visit Diagnosis:    ICD-10-CM   1. Major depressive disorder, single episode, mild (HCC) F32.0 buPROPion (WELLBUTRIN XL) 300 MG 24 hr tablet  2. Anxiety state F41.1 ALPRAZolam (XANAX) 0.5 MG tablet    DISCONTINUED: ALPRAZolam (XANAX) 0.5 MG tablet    Past Psychiatric History:  Bipolar Disorder: No Depression: Yes Mania: No Psychosis: No Schizophrenia: No Personality Disorder: No Hospitalization for psychiatric illness: No History of Electroconvulsive Shock Therapy: No Prior Suicide Attempts: No   Past Medical History:  Past Medical History:  Diagnosis Date  . Anxiety   . Depression   . GERD (gastroesophageal reflux disease)     Past Surgical History:  Procedure Laterality Date  . VASECTOMY  2014    Family Psychiatric History: Family History  Problem Relation Age of Onset  . ADD / ADHD Son   . Bipolar disorder Son   . Alcohol abuse Neg Hx   . Anxiety disorder Neg Hx   . Dementia Neg Hx   . Depression Neg Hx   . Drug abuse Neg Hx   . Schizophrenia Neg Hx   . Suicidality Neg Hx      Social History:  Social History   Socioeconomic History  . Marital status: Married    Spouse name: None  . Number of children: None  . Years of education: None  . Highest education level: None  Social Needs  . Financial resource strain: None  . Food insecurity - worry: None  . Food insecurity - inability: None  . Transportation needs - medical: None  . Transportation needs - non-medical: None  Occupational History  . None  Tobacco Use  . Smoking status: Former Smoker    Last attempt to quit: 11/29/2007    Years since quitting: 9.8  . Smokeless tobacco: Never Used  Substance and Sexual Activity  . Alcohol use: Yes    Alcohol/week: 3.0 oz    Types: 5 Cans of beer per week    Comment: a few times a week  . Drug use: No  . Sexual activity: None  Other Topics Concern  . None  Social History Narrative  . None    Allergies: No Known Allergies  Metabolic Disorder Labs: No results found for: HGBA1C, MPG No results found for: PROLACTIN No results found for: CHOL, TRIG, HDL, CHOLHDL, VLDL, LDLCALC No results found for: TSH  Therapeutic Level Labs: No results found for: LITHIUM No results found for: VALPROATE No components found for:  CBMZ  Current Medications: Current Outpatient Medications  Medication  Sig Dispense Refill  . ALPRAZolam (XANAX) 0.5 MG tablet Take 1 tablet (0.5 mg total) by mouth daily as needed. 15 tablet 2  . buPROPion (WELLBUTRIN XL) 300 MG 24 hr tablet Take 1 tablet (300 mg total) by mouth daily. 30 tablet 3  . Fish Oil OIL Take 1 tablet by mouth daily.    . Multiple Vitamin (MULTIVITAMIN) capsule Take 1 capsule by mouth daily.    . Omeprazole 20 MG TBEC Take 20-40 mg by mouth daily.     No current facility-administered medications for this visit.      Musculoskeletal: Strength & Muscle Tone: within normal limits Gait & Station: normal Patient leans: N/A  Psychiatric Specialty Exam: Review of Systems  Constitutional: Negative for chills  and fever.  HENT: Negative for congestion, ear pain, sinus pain and sore throat.   Neurological: Negative for weakness.    Blood pressure 122/78, pulse 73, height 5\' 10"  (1.778 m), weight 205 lb (93 kg).Body mass index is 29.41 kg/m.  General Appearance: Fairly Groomed  Eye Contact:  Good  Speech:  Clear and Coherent and Normal Rate  Volume:  Normal  Mood:  Euthymic  Affect:  Full Range  Thought Process:  Goal Directed and Descriptions of Associations: Intact  Orientation:  Full (Time, Place, and Person)  Thought Content: Logical   Suicidal Thoughts:  No  Homicidal Thoughts:  No  Memory:  Immediate;   Good Recent;   Good Remote;   Good  Judgement:  Good  Insight:  Good  Psychomotor Activity:  Normal  Concentration:  Concentration: Good and Attention Span: Good  Recall:  Good  Fund of Knowledge: Good  Language: Good  Akathisia:  No  Handed:  Right  AIMS (if indicated): not done  Assets:  Communication Skills Desire for Improvement Financial Resources/Insurance Housing Intimacy Leisure Time Resilience Social Support Talents/Skills Transportation Vocational/Educational  ADL's:  Intact  Cognition: WNL  Sleep:  Good   Screenings:   Assessment and Plan: MDD-recurrent, mild with anxiety    Medication management with supportive therapy. Risks and benefits, side effects and alternative treatment options discussed with patient. Pt was given an opportunity to ask questions about medication, illness, and treatment. All current psychiatric medications have been reviewed and discussed with the patient and adjusted as clinically appropriate. The patient has been provided an accurate and updated list of the medications being now prescribed. Patient expressed understanding of how their medications were to be used.  Pt verbalized understanding and verbal consent obtained for treatment.  Status of current problems: stable  Meds: Wellbutrin XL 300mg  po qD for MDD Xanax 0.5mg  po qD  prn anxiety- rarely uses it   Labs: none  Therapy: brief supportive therapy provided. Discussed psychosocial stressors in detail.     Consultations: Encouraged to follow up with therapist Encouraged to follow up with PCP as needed  Pt denies SI and is at an acute low risk for suicide. Patient told to call clinic if any problems occur. Patient advised to go to ER if they should develop SI/HI, side effects, or if symptoms worsen. Has crisis numbers to call if needed. Pt verbalized understanding.  F/up in 4 months or sooner if needed    Oletta DarterSalina Gloristine Turrubiates, MD 09/21/2017, 1:17 PM

## 2017-10-05 DIAGNOSIS — J069 Acute upper respiratory infection, unspecified: Secondary | ICD-10-CM | POA: Diagnosis not present

## 2017-11-29 DIAGNOSIS — Z0000 Encounter for general adult medical examination without abnormal findings: Secondary | ICD-10-CM | POA: Diagnosis not present

## 2017-11-29 DIAGNOSIS — F338 Other recurrent depressive disorders: Secondary | ICD-10-CM | POA: Diagnosis not present

## 2017-11-29 DIAGNOSIS — K219 Gastro-esophageal reflux disease without esophagitis: Secondary | ICD-10-CM | POA: Diagnosis not present

## 2017-11-29 DIAGNOSIS — E7800 Pure hypercholesterolemia, unspecified: Secondary | ICD-10-CM | POA: Diagnosis not present

## 2017-12-18 DIAGNOSIS — F3289 Other specified depressive episodes: Secondary | ICD-10-CM | POA: Diagnosis not present

## 2017-12-18 DIAGNOSIS — F411 Generalized anxiety disorder: Secondary | ICD-10-CM | POA: Diagnosis not present

## 2017-12-25 DIAGNOSIS — F3289 Other specified depressive episodes: Secondary | ICD-10-CM | POA: Diagnosis not present

## 2017-12-25 DIAGNOSIS — F411 Generalized anxiety disorder: Secondary | ICD-10-CM | POA: Diagnosis not present

## 2018-01-23 DIAGNOSIS — F411 Generalized anxiety disorder: Secondary | ICD-10-CM | POA: Diagnosis not present

## 2018-01-23 DIAGNOSIS — F3289 Other specified depressive episodes: Secondary | ICD-10-CM | POA: Diagnosis not present

## 2018-01-25 ENCOUNTER — Ambulatory Visit (HOSPITAL_COMMUNITY): Payer: BLUE CROSS/BLUE SHIELD | Admitting: Psychiatry

## 2018-01-25 ENCOUNTER — Encounter (HOSPITAL_COMMUNITY): Payer: Self-pay | Admitting: Psychiatry

## 2018-01-25 DIAGNOSIS — F1099 Alcohol use, unspecified with unspecified alcohol-induced disorder: Secondary | ICD-10-CM | POA: Diagnosis not present

## 2018-01-25 DIAGNOSIS — Z87891 Personal history of nicotine dependence: Secondary | ICD-10-CM

## 2018-01-25 DIAGNOSIS — R450 Nervousness: Secondary | ICD-10-CM | POA: Diagnosis not present

## 2018-01-25 DIAGNOSIS — F320 Major depressive disorder, single episode, mild: Secondary | ICD-10-CM | POA: Diagnosis not present

## 2018-01-25 DIAGNOSIS — Z818 Family history of other mental and behavioral disorders: Secondary | ICD-10-CM | POA: Diagnosis not present

## 2018-01-25 MED ORDER — BUPROPION HCL ER (XL) 150 MG PO TB24: 450.0000 mg | ORAL_TABLET | Freq: Every day | ORAL | 3 refills | Status: DC

## 2018-01-25 NOTE — Progress Notes (Signed)
BH MD/PA/NP OP Progress Note  01/25/2018 1:26 PM Thomas Kelly  MRN:  [id number]  Chief Complaint:  Chief Complaint    Follow-up     HPI: "Good". He has been working a lot but it is going well. He went to visit his son last week and enjoyed the trip. He has not taken Xanax in several months. He doesn't like the SE of GI upset. He doesn't feel the need to Xanax so he has not refilled it. Anxiety is manageable. He is seeing a new CBT therapist. Thomas Kelly had been working with same therapist since 2010 and his life has changed. He realizes that his thoughts often cause him anxiety.He is working out and swimming a lot. Depression "is there" but is related to feeling lonely due to his family aging. When he thinks about it can overwhelming. His depression is tied to anxious stressors. Thomas Kelly no longer feels so overwhelmed and crying. Sleep is good. He denies SI/HI.  Wellbutrin helps but is not optimized.   Visit Diagnosis:    ICD-10-CM   1. Major depressive disorder, single episode, mild (HCC) F32.0 buPROPion (WELLBUTRIN XL) 150 MG 24 hr tablet    Past Psychiatric History:  Bipolar Disorder:No Depression:Yes Mania:No Psychosis:No Schizophrenia:No Personality Disorder:No Hospitalization for psychiatric illness:No History of Electroconvulsive Shock Therapy:No Prior Suicide Attempts:No    Past Medical History:  Past Medical History:  Diagnosis Date  . Anxiety   . Depression   . GERD (gastroesophageal reflux disease)     Past Surgical History:  Procedure Laterality Date  . VASECTOMY  2014    Family Psychiatric History:  Family History  Problem Relation Age of Onset  . ADD / ADHD Son   . Bipolar disorder Son   . Alcohol abuse Neg Hx   . Anxiety disorder Neg Hx   . Dementia Neg Hx   . Depression Neg Hx   . Drug abuse Neg Hx   . Schizophrenia Neg Hx   . Suicidality Neg Hx     Social History:  Social History   Socioeconomic History  . Marital status: Married    Spouse  name: Not on file  . Number of children: Not on file  . Years of education: Not on file  . Highest education level: Not on file  Occupational History  . Not on file  Social Needs  . Financial resource strain: Not on file  . Food insecurity:    Worry: Not on file    Inability: Not on file  . Transportation needs:    Medical: Not on file    Non-medical: Not on file  Tobacco Use  . Smoking status: Former Smoker    Last attempt to quit: 11/29/2007    Years since quitting: 10.1  . Smokeless tobacco: Never Used  Substance and Sexual Activity  . Alcohol use: Yes    Alcohol/week: 3.6 oz    Types: 6 Shots of liquor per week    Comment: a few times a week  . Drug use: No  . Sexual activity: Yes    Partners: Female    Birth control/protection: None  Lifestyle  . Physical activity:    Days per week: Not on file    Minutes per session: Not on file  . Stress: Not on file  Relationships  . Social connections:    Talks on phone: Not on file    Gets together: Not on file    Attends religious service: Not on file    Active member  of club or organization: Not on file    Attends meetings of clubs or organizations: Not on file    Relationship status: Not on file  Other Topics Concern  . Not on file  Social History Narrative  . Not on file    Allergies: No Known Allergies  Metabolic Disorder Labs: No results found for: HGBA1C, MPG No results found for: PROLACTIN No results found for: CHOL, TRIG, HDL, CHOLHDL, VLDL, LDLCALC No results found for: TSH  Therapeutic Level Labs: No results found for: LITHIUM No results found for: VALPROATE No components found for:  CBMZ  Current Medications: Current Outpatient Medications  Medication Sig Dispense Refill  . buPROPion (WELLBUTRIN XL) 150 MG 24 hr tablet Take 3 tablets (450 mg total) by mouth daily. 90 tablet 3  . Multiple Vitamin (MULTIVITAMIN) capsule Take 1 capsule by mouth daily.    . Omeprazole 20 MG TBEC Take 20-40 mg by mouth  daily.    . pravastatin (PRAVACHOL) 10 MG tablet Take 10 mg by mouth daily.  1  . ALPRAZolam (XANAX) 0.5 MG tablet Take 1 tablet (0.5 mg total) by mouth daily as needed. (Patient not taking: Reported on 01/25/2018) 15 tablet 2  . Fish Oil OIL Take 1 tablet by mouth daily.     No current facility-administered medications for this visit.      Musculoskeletal: Strength & Muscle Tone: within normal limits Gait & Station: normal Patient leans: N/A  Psychiatric Specialty Exam: Review of Systems  Constitutional: Negative for chills, diaphoresis and fever.  Psychiatric/Behavioral: Positive for depression. Negative for hallucinations, substance abuse and suicidal ideas. The patient is nervous/anxious. The patient does not have insomnia.     Blood pressure 124/80, pulse 75, height 5' 9.5" (1.765 m), weight 200 lb (90.7 kg), SpO2 94 %.Body mass index is 29.11 kg/m.  General Appearance: Fairly Groomed  Eye Contact:  Good  Speech:  Clear and Coherent and Normal Rate  Volume:  Normal  Mood:  Depressed  Affect:  Full Range  Thought Process:  Goal Directed and Descriptions of Associations: Intact  Orientation:  Full (Time, Place, and Person)  Thought Content: Logical   Suicidal Thoughts:  No  Homicidal Thoughts:  No  Memory:  Immediate;   Good Recent;   Good Remote;   Good  Judgement:  Good  Insight:  Good  Psychomotor Activity:  Normal  Concentration:  Concentration: Good and Attention Span: Good  Recall:  Good  Fund of Knowledge: Good  Language: Good  Akathisia:  No  Handed:  Right  AIMS (if indicated): not done  Assets:  Communication Skills Desire for Improvement Financial Resources/Insurance Housing Intimacy Leisure Time Physical Health Resilience Social Support Talents/Skills Transportation Vocational/Educational  ADL's:  Intact  Cognition: WNL  Sleep:  Good   Screenings:   Assessment and Plan: MDD-recurrent, mild with anxiety    Medication management with  supportive therapy. Risks and benefits, side effects and alternative treatment options discussed with patient. Pt was given an opportunity to ask questions about medication, illness, and treatment. All current psychiatric medications have been reviewed and discussed with the patient and adjusted as clinically appropriate. The patient has been provided an accurate and updated list of the medications being now prescribed. Patient expressed understanding of how their medications were to be used.  Pt verbalized understanding and verbal consent obtained for treatment.  Status of current problems: mildly worsening depression  Meds: increase Wellbutrin XL 450gm po qD for MDD Xanax 0.5mg  po qD prn anxiety- pt  rarely takes it   Labs: none  Therapy: brief supportive therapy provided. Discussed psychosocial stressors in detail.     Consultations: Encouraged to follow up with therapist Encouraged to follow up with PCP as needed  Pt denies SI and is at an acute low risk for suicide. Patient told to call clinic if any problems occur. Patient advised to go to ER if they should develop SI/HI, side effects, or if symptoms worsen. Has crisis numbers to call if needed. Pt verbalized understanding.  F/up in 3 months or sooner if needed  The duration of this appointment visit was 20 minutes of face-to-face time with the patient.  Greater than 50% of this time was spent in counseling, explanation of  diagnosis, planning of further management, and coordination of care   Oletta DarterSalina Breanda Greenlaw, MD 01/25/2018, 1:26 PM

## 2018-01-31 DIAGNOSIS — F411 Generalized anxiety disorder: Secondary | ICD-10-CM | POA: Diagnosis not present

## 2018-01-31 DIAGNOSIS — F3289 Other specified depressive episodes: Secondary | ICD-10-CM | POA: Diagnosis not present

## 2018-02-08 DIAGNOSIS — F411 Generalized anxiety disorder: Secondary | ICD-10-CM | POA: Diagnosis not present

## 2018-02-08 DIAGNOSIS — F3289 Other specified depressive episodes: Secondary | ICD-10-CM | POA: Diagnosis not present

## 2018-02-15 DIAGNOSIS — F3289 Other specified depressive episodes: Secondary | ICD-10-CM | POA: Diagnosis not present

## 2018-02-15 DIAGNOSIS — F411 Generalized anxiety disorder: Secondary | ICD-10-CM | POA: Diagnosis not present

## 2018-02-23 DIAGNOSIS — F411 Generalized anxiety disorder: Secondary | ICD-10-CM | POA: Diagnosis not present

## 2018-02-23 DIAGNOSIS — F3289 Other specified depressive episodes: Secondary | ICD-10-CM | POA: Diagnosis not present

## 2018-03-05 DIAGNOSIS — F3289 Other specified depressive episodes: Secondary | ICD-10-CM | POA: Diagnosis not present

## 2018-03-05 DIAGNOSIS — F411 Generalized anxiety disorder: Secondary | ICD-10-CM | POA: Diagnosis not present

## 2018-03-14 DIAGNOSIS — F411 Generalized anxiety disorder: Secondary | ICD-10-CM | POA: Diagnosis not present

## 2018-03-14 DIAGNOSIS — F3289 Other specified depressive episodes: Secondary | ICD-10-CM | POA: Diagnosis not present

## 2018-03-21 DIAGNOSIS — F3289 Other specified depressive episodes: Secondary | ICD-10-CM | POA: Diagnosis not present

## 2018-03-21 DIAGNOSIS — F411 Generalized anxiety disorder: Secondary | ICD-10-CM | POA: Diagnosis not present

## 2018-03-27 DIAGNOSIS — F3289 Other specified depressive episodes: Secondary | ICD-10-CM | POA: Diagnosis not present

## 2018-03-27 DIAGNOSIS — F411 Generalized anxiety disorder: Secondary | ICD-10-CM | POA: Diagnosis not present

## 2018-04-06 DIAGNOSIS — F3289 Other specified depressive episodes: Secondary | ICD-10-CM | POA: Diagnosis not present

## 2018-04-06 DIAGNOSIS — F411 Generalized anxiety disorder: Secondary | ICD-10-CM | POA: Diagnosis not present

## 2018-04-16 DIAGNOSIS — F3289 Other specified depressive episodes: Secondary | ICD-10-CM | POA: Diagnosis not present

## 2018-04-16 DIAGNOSIS — F411 Generalized anxiety disorder: Secondary | ICD-10-CM | POA: Diagnosis not present

## 2018-04-19 ENCOUNTER — Encounter (HOSPITAL_COMMUNITY): Payer: Self-pay | Admitting: Psychiatry

## 2018-04-19 ENCOUNTER — Other Ambulatory Visit: Payer: Self-pay

## 2018-04-19 ENCOUNTER — Ambulatory Visit (INDEPENDENT_AMBULATORY_CARE_PROVIDER_SITE_OTHER): Payer: BLUE CROSS/BLUE SHIELD | Admitting: Psychiatry

## 2018-04-19 DIAGNOSIS — F419 Anxiety disorder, unspecified: Secondary | ICD-10-CM

## 2018-04-19 DIAGNOSIS — Z87891 Personal history of nicotine dependence: Secondary | ICD-10-CM | POA: Diagnosis not present

## 2018-04-19 DIAGNOSIS — F320 Major depressive disorder, single episode, mild: Secondary | ICD-10-CM

## 2018-04-19 MED ORDER — BUPROPION HCL ER (XL) 150 MG PO TB24: 450.0000 mg | ORAL_TABLET | Freq: Every day | ORAL | 4 refills | Status: DC

## 2018-04-19 NOTE — Progress Notes (Signed)
BH MD/PA/NP OP Progress Note  04/19/2018 3:18 PM Thomas Kelly  MRN:  [id number]  Chief Complaint:  Chief Complaint    Follow-up     HPI: Patient states that he is doing better.  He feels that the increased dose of the Wellbutrin has been very effective.  In addition he has been engaging in CBT and feels that it is very helpful.  He has no concerns about his depression and is denying SI/HI.  Overall his anxiety has decreased.  Thomas Kelly also states that instead of focusing on his internal crises he has been focusing outward and has learned that he has some work to do on himself.  He has been taking the Wellbutrin as prescribed and denies any side effects.   Visit Diagnosis:    ICD-10-CM   1. Major depressive disorder, single episode, mild (HCC) F32.0 buPROPion (WELLBUTRIN XL) 150 MG 24 hr tablet    Past Psychiatric History:  Bipolar Disorder:No Depression:Yes Mania:No Psychosis:No Schizophrenia:No Personality Disorder:No Hospitalization for psychiatric illness:No History of Electroconvulsive Shock Therapy:No Prior Suicide Attempts:No    Past Medical History:  Past Medical History:  Diagnosis Date  . Anxiety   . Depression   . GERD (gastroesophageal reflux disease)     Past Surgical History:  Procedure Laterality Date  . VASECTOMY  2014    Family Psychiatric History:  Family History  Problem Relation Age of Onset  . ADD / ADHD Son   . Bipolar disorder Son   . Alcohol abuse Neg Hx   . Anxiety disorder Neg Hx   . Dementia Neg Hx   . Depression Neg Hx   . Drug abuse Neg Hx   . Schizophrenia Neg Hx   . Suicidality Neg Hx     Social History:  Social History   Socioeconomic History  . Marital status: Married    Spouse name: Not on file  . Number of children: Not on file  . Years of education: Not on file  . Highest education level: Not on file  Occupational History  . Not on file  Social Needs  . Financial resource strain: Not on file  . Food  insecurity:    Worry: Not on file    Inability: Not on file  . Transportation needs:    Medical: Not on file    Non-medical: Not on file  Tobacco Use  . Smoking status: Former Smoker    Last attempt to quit: 11/29/2007    Years since quitting: 10.3  . Smokeless tobacco: Never Used  Substance and Sexual Activity  . Alcohol use: Yes    Alcohol/week: 6.0 standard drinks    Types: 6 Shots of liquor per week    Comment: a few times a week  . Drug use: No  . Sexual activity: Yes    Partners: Female    Birth control/protection: None  Lifestyle  . Physical activity:    Days per week: Not on file    Minutes per session: Not on file  . Stress: Not on file  Relationships  . Social connections:    Talks on phone: Not on file    Gets together: Not on file    Attends religious service: Not on file    Active member of club or organization: Not on file    Attends meetings of clubs or organizations: Not on file    Relationship status: Not on file  Other Topics Concern  . Not on file  Social History Narrative  . Not  on file    Allergies: No Known Allergies  Metabolic Disorder Labs: No results found for: HGBA1C, MPG No results found for: PROLACTIN No results found for: CHOL, TRIG, HDL, CHOLHDL, VLDL, LDLCALC No results found for: TSH  Therapeutic Level Labs: No results found for: LITHIUM No results found for: VALPROATE No components found for:  CBMZ  Current Medications: Current Outpatient Medications  Medication Sig Dispense Refill  . buPROPion (WELLBUTRIN XL) 150 MG 24 hr tablet Take 3 tablets (450 mg total) by mouth daily. 90 tablet 4  . Multiple Vitamin (MULTIVITAMIN) capsule Take 1 capsule by mouth daily.    . Omeprazole 20 MG TBEC Take 20-40 mg by mouth daily.    . pravastatin (PRAVACHOL) 10 MG tablet Take 10 mg by mouth daily.  1  . ALPRAZolam (XANAX) 0.5 MG tablet Take 1 tablet (0.5 mg total) by mouth daily as needed. (Patient not taking: Reported on 01/25/2018) 15  tablet 2  . Fish Oil OIL Take 1 tablet by mouth daily.     No current facility-administered medications for this visit.      Musculoskeletal: Strength & Muscle Tone: within normal limits Gait & Station: normal Patient leans: N/A  Psychiatric Specialty Exam: Review of Systems  Constitutional: Negative for chills, diaphoresis and fever.  Respiratory: Negative for cough, sputum production and wheezing.     Blood pressure 123/78, pulse 76, weight 206 lb 6.4 oz (93.6 kg), SpO2 94 %.Body mass index is 30.04 kg/m.  General Appearance: Fairly Groomed  Eye Contact:  Good  Speech:  Clear and Coherent and Normal Rate  Volume:  Normal  Mood:  Euthymic  Affect:  Full Range  Thought Process:  Goal Directed and Descriptions of Associations: Intact  Orientation:  Full (Time, Place, and Person)  Thought Content:  Logical  Suicidal Thoughts:  No  Homicidal Thoughts:  No  Memory:  Immediate;   Good  Judgement:  Good  Insight:  Good  Psychomotor Activity:  Normal  Concentration:  Concentration: Good  Recall:  Good  Fund of Knowledge:  Good  Language:  Good  Akathisia:  No  Handed:  Right  AIMS (if indicated):     Assets:  Communication Skills Desire for Improvement Financial Resources/Insurance Housing Intimacy Leisure Time Physical Health Resilience Social Support Talents/Skills Transportation Vocational/Educational  ADL's:  Intact  Cognition:  WNL  Sleep:   fair     Screenings:  I reviewed the information below on 04/19/2018 and have updated it Assessment and Plan: MDD-recurrent, mild with anxiety    Medication management with supportive therapy. Risks and benefits, side effects and alternative treatment options discussed with patient. Pt was given an opportunity to ask questions about medication, illness, and treatment. All current psychiatric medications have been reviewed and discussed with the patient and adjusted as clinically appropriate. The patient has been  provided an accurate and updated list of the medications being now prescribed. Patient expressed understanding of how their medications were to be used.  Pt verbalized understanding and verbal consent obtained for treatment.  Status of current problems: improved depression  Meds: Wellbutrin XL 450gm po qD for MDD Xanax 0.5mg  po qD prn anxiety- pt rarely takes it   Labs: none  Therapy: brief supportive therapy provided. Discussed psychosocial stressors in detail.     Consultations: Encouraged to follow up with therapist-feels that CBT is working well for him Encouraged to follow up with PCP as needed  Pt denies SI and is at an acute low risk for suicide.  Patient told to call clinic if any problems occur. Patient advised to go to ER if they should develop SI/HI, side effects, or if symptoms worsen. Has crisis numbers to call if needed. Pt verbalized understanding.  F/up in 4 months or sooner if needed  The duration of this appointment visit was 15 minutes of face-to-face time with the patient.  Greater than 50% of this time was spent in counseling, explanation of  diagnosis, planning of further management, and coordination of care   Oletta Darter, MD 04/19/2018, 3:18 PM

## 2018-05-02 DIAGNOSIS — F3289 Other specified depressive episodes: Secondary | ICD-10-CM | POA: Diagnosis not present

## 2018-05-02 DIAGNOSIS — F411 Generalized anxiety disorder: Secondary | ICD-10-CM | POA: Diagnosis not present

## 2018-05-11 DIAGNOSIS — F3289 Other specified depressive episodes: Secondary | ICD-10-CM | POA: Diagnosis not present

## 2018-05-11 DIAGNOSIS — F411 Generalized anxiety disorder: Secondary | ICD-10-CM | POA: Diagnosis not present

## 2018-05-21 DIAGNOSIS — F411 Generalized anxiety disorder: Secondary | ICD-10-CM | POA: Diagnosis not present

## 2018-05-21 DIAGNOSIS — F3289 Other specified depressive episodes: Secondary | ICD-10-CM | POA: Diagnosis not present

## 2018-05-28 DIAGNOSIS — F411 Generalized anxiety disorder: Secondary | ICD-10-CM | POA: Diagnosis not present

## 2018-05-28 DIAGNOSIS — F3289 Other specified depressive episodes: Secondary | ICD-10-CM | POA: Diagnosis not present

## 2018-06-04 DIAGNOSIS — F3289 Other specified depressive episodes: Secondary | ICD-10-CM | POA: Diagnosis not present

## 2018-06-04 DIAGNOSIS — F411 Generalized anxiety disorder: Secondary | ICD-10-CM | POA: Diagnosis not present

## 2018-06-11 ENCOUNTER — Other Ambulatory Visit (HOSPITAL_COMMUNITY): Payer: Self-pay | Admitting: Psychiatry

## 2018-06-11 DIAGNOSIS — F411 Generalized anxiety disorder: Secondary | ICD-10-CM

## 2018-06-12 DIAGNOSIS — F411 Generalized anxiety disorder: Secondary | ICD-10-CM | POA: Diagnosis not present

## 2018-06-12 DIAGNOSIS — F3289 Other specified depressive episodes: Secondary | ICD-10-CM | POA: Diagnosis not present

## 2018-06-19 ENCOUNTER — Other Ambulatory Visit (HOSPITAL_COMMUNITY): Payer: Self-pay

## 2018-06-19 DIAGNOSIS — F411 Generalized anxiety disorder: Secondary | ICD-10-CM

## 2018-06-19 MED ORDER — ALPRAZOLAM 0.5 MG PO TABS: 0.5000 mg | ORAL_TABLET | Freq: Every day | ORAL | 2 refills | Status: DC | PRN

## 2018-06-28 DIAGNOSIS — F3289 Other specified depressive episodes: Secondary | ICD-10-CM | POA: Diagnosis not present

## 2018-06-28 DIAGNOSIS — F411 Generalized anxiety disorder: Secondary | ICD-10-CM | POA: Diagnosis not present

## 2018-07-19 DIAGNOSIS — F411 Generalized anxiety disorder: Secondary | ICD-10-CM | POA: Diagnosis not present

## 2018-07-19 DIAGNOSIS — F3289 Other specified depressive episodes: Secondary | ICD-10-CM | POA: Diagnosis not present

## 2018-07-25 DIAGNOSIS — Z23 Encounter for immunization: Secondary | ICD-10-CM | POA: Diagnosis not present

## 2018-07-26 DIAGNOSIS — F411 Generalized anxiety disorder: Secondary | ICD-10-CM | POA: Diagnosis not present

## 2018-07-26 DIAGNOSIS — F3289 Other specified depressive episodes: Secondary | ICD-10-CM | POA: Diagnosis not present

## 2018-08-03 DIAGNOSIS — F411 Generalized anxiety disorder: Secondary | ICD-10-CM | POA: Diagnosis not present

## 2018-08-03 DIAGNOSIS — F3289 Other specified depressive episodes: Secondary | ICD-10-CM | POA: Diagnosis not present

## 2018-08-08 DIAGNOSIS — F411 Generalized anxiety disorder: Secondary | ICD-10-CM | POA: Diagnosis not present

## 2018-08-08 DIAGNOSIS — F3289 Other specified depressive episodes: Secondary | ICD-10-CM | POA: Diagnosis not present

## 2018-08-15 DIAGNOSIS — F411 Generalized anxiety disorder: Secondary | ICD-10-CM | POA: Diagnosis not present

## 2018-08-15 DIAGNOSIS — F3289 Other specified depressive episodes: Secondary | ICD-10-CM | POA: Diagnosis not present

## 2018-08-23 ENCOUNTER — Ambulatory Visit (HOSPITAL_COMMUNITY): Payer: BLUE CROSS/BLUE SHIELD | Admitting: Psychiatry

## 2018-08-23 ENCOUNTER — Encounter (HOSPITAL_COMMUNITY): Payer: Self-pay | Admitting: Psychiatry

## 2018-08-23 DIAGNOSIS — F320 Major depressive disorder, single episode, mild: Secondary | ICD-10-CM | POA: Diagnosis not present

## 2018-08-23 DIAGNOSIS — F411 Generalized anxiety disorder: Secondary | ICD-10-CM | POA: Diagnosis not present

## 2018-08-23 MED ORDER — BUPROPION HCL ER (XL) 150 MG PO TB24: 450.0000 mg | ORAL_TABLET | Freq: Every day | ORAL | 4 refills | Status: DC

## 2018-08-23 MED ORDER — ALPRAZOLAM 0.5 MG PO TABS: 0.5000 mg | ORAL_TABLET | Freq: Every day | ORAL | 1 refills | Status: DC | PRN

## 2018-08-23 NOTE — Progress Notes (Signed)
BH MD/PA/NP OP Progress Note  08/23/2018 9:20 AM Thomas Kelly  MRN:  [id number]  Chief Complaint:  Chief Complaint    Follow-up; Medication Refill     HPI: Patient reports he is doing well and has no concerns at this time.  His anxiety typically comes on with the beginning in and day of a semester throughout the year.  He is aware of this and is using coping skills.  He feels as though he is better handling these transitions.  He is actively participating in CBT.  His responsibilities at work have changed.  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  He now has full custody of his son until April.  He really feels that the Wellbutrin is helping.  The holidays were mildly stressful so he requested a refill on Xanax but overall felt that the holidays went well.  He has some anxiety in the back of his mind related to work.  He states his depression is mild and manageable.  He has good days.  Even on bad days he uses coping skills and remains active.  He states that neither interfere with his quality of life or interactions or work.  He continues to work out, travel, Loss adjuster, [employer] on and off.  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  Since he is doing so well he is requesting a 661-month follow-up but states he will call and schedule an earlier appointment if needed.   Visit Diagnosis:    ICD-10-CM   1. Anxiety state F41.1 ALPRAZolam (XANAX) 0.5 MG tablet  2. Major depressive disorder, single episode, mild (HCC) F32.0 buPROPion (WELLBUTRIN XL) 150 MG 24 hr tablet    Past Psychiatric History:  Bipolar Disorder:No Depression:Yes Mania:No Psychosis:No Schizophrenia:No Personality Disorder:No Hospitalization for psychiatric illness:No History of Electroconvulsive Shock Therapy:No Prior Suicide Attempts:No    Past Medical History:  Past Medical History:  Diagnosis Date  . Anxiety   .  Depression   . GERD (gastroesophageal reflux disease)     Past Surgical History:  Procedure Laterality Date  . VASECTOMY  2014    Family Psychiatric History:  Family History  Problem Relation Age of Onset  . ADD / ADHD Son   . Bipolar disorder Son   . Alcohol abuse Neg Hx   . Anxiety disorder Neg Hx   . Dementia Neg Hx   . Depression Neg Hx   . Drug abuse Neg Hx   . Schizophrenia Neg Hx   . Suicidality Neg Hx     Social History:  Social History   Socioeconomic History  . Marital status: Married    Spouse name: Not on file  . Number of children: Not on file  . Years of education: Not on file  . Highest education level: Not on file  Occupational History  . Not on file  Social Needs  . Financial resource strain: Not on file  . Food insecurity:    Worry: Not on file    Inability: Not on file  . Transportation needs:    Medical: Not on file    Non-medical: Not on file  Tobacco Use  . Smoking status: Former Smoker    Last attempt to quit: 11/29/2007    Years since quitting: 10.7  . Smokeless tobacco: Never Used  Substance and Sexual Activity  . Alcohol use: Yes    Alcohol/week: 6.0 standard drinks  Types: 6 Shots of liquor per week    Comment: a few times a week  . Drug use: No  . Sexual activity: Yes    Partners: Female    Birth control/protection: None  Lifestyle  . Physical activity:    Days per week: Not on file    Minutes per session: Not on file  . Stress: Not on file  Relationships  . Social connections:    Talks on phone: Not on file    Gets together: Not on file    Attends religious service: Not on file    Active member of club or organization: Not on file    Attends meetings of clubs or organizations: Not on file    Relationship status: Not on file  Other Topics Concern  . Not on file  Social History Narrative  . Not on file    Allergies: No Known Allergies  Metabolic Disorder Labs: No results found for: HGBA1C, MPG No results found  for: PROLACTIN No results found for: CHOL, TRIG, HDL, CHOLHDL, VLDL, LDLCALC No results found for: TSH  Therapeutic Level Labs: No results found for: LITHIUM No results found for: VALPROATE No components found for:  CBMZ  Current Medications: Current Outpatient Medications  Medication Sig Dispense Refill  . ALPRAZolam (XANAX) 0.5 MG tablet Take 1 tablet (0.5 mg total) by mouth daily as needed. 15 tablet 1  . buPROPion (WELLBUTRIN XL) 150 MG 24 hr tablet Take 3 tablets (450 mg total) by mouth daily. 90 tablet 4  . Multiple Vitamin (MULTIVITAMIN) capsule Take 1 capsule by mouth daily.    . Omeprazole 20 MG TBEC Take 20-40 mg by mouth daily.    . pravastatin (PRAVACHOL) 10 MG tablet Take 10 mg by mouth daily.  1   No current facility-administered medications for this visit.      Musculoskeletal: Strength & Muscle Tone: within normal limits Gait & Station: normal Patient leans: N/A  Psychiatric Specialty Exam: Review of Systems  Constitutional: Negative for chills, diaphoresis and fever.  Respiratory: Negative for cough and sputum production.   Cardiovascular: Negative for chest pain, palpitations and leg swelling.    Blood pressure 132/80, pulse 70, height 5\' 10"  (1.778 m), weight 213 lb (96.6 kg).Body mass index is 30.56 kg/m.  General Appearance: Fairly Groomed  Eye Contact:  Good  Speech:  Clear and Coherent and Normal Rate  Volume:  Normal  Mood:  Euthymic  Affect:  Full Range  Thought Process:  Goal Directed and Descriptions of Associations: Intact  Orientation:  Full (Time, Place, and Person)  Thought Content:  Logical  Suicidal Thoughts:  No  Homicidal Thoughts:  No  Memory:  Immediate;   Good  Judgement:  Good  Insight:  Good  Psychomotor Activity:  Normal  Concentration:  Concentration: Good  Recall:  Good  Fund of Knowledge:  Good  Language:  Good  Akathisia:  No  Handed:  Right  AIMS (if indicated):     Assets:  Communication Skills Desire for  Improvement Financial Resources/Insurance Housing Intimacy Leisure Time Physical Health Resilience Social Support Talents/Skills Transportation Vocational/Educational  ADL's:  Intact  Cognition:  WNL  Sleep:   good       Screenings:  I reviewed the information below on 08/23/2018 and have updated it Assessment and Plan: MDD-recurrent, mild with anxiety    Medication management with supportive therapy. Risks and benefits, side effects and alternative treatment options discussed with patient. Pt was given an opportunity to ask questions  about medication, illness, and treatment. All current psychiatric medications have been reviewed and discussed with the patient and adjusted as clinically appropriate. The patient has been provided an accurate and updated list of the medications being now prescribed. Patient expressed understanding of how their medications were to be used.  Pt verbalized understanding and verbal consent obtained for treatment.  Status of current problems: doing well, no concerns at this time  Meds: Wellbutrin XL 450gm po qD for MDD Xanax 0.5mg  po qD prn anxiety- pt rarely takes it   Labs: none  Therapy: brief supportive therapy provided. Discussed psychosocial stressors in detail.     Consultations: Encouraged to follow up with therapist-feels that CBT is working well for him Encouraged to follow up with PCP as needed  Pt denies SI and is at an acute low risk for suicide. Patient told to call clinic if any problems occur. Patient advised to go to ER if they should develop SI/HI, side effects, or if symptoms worsen. Has crisis numbers to call if needed. Pt verbalized understanding.  F/up in 6 months or sooner if needed  The duration of this appointment visit was 20 minutes of face-to-face time with the patient.  Greater than 50% of this time was spent in counseling, explanation of  diagnosis, planning of further management, and coordination of care   Oletta Darter, MD 08/23/2018, 9:20 AM

## 2018-08-29 DIAGNOSIS — X32XXXD Exposure to sunlight, subsequent encounter: Secondary | ICD-10-CM | POA: Diagnosis not present

## 2018-08-29 DIAGNOSIS — F3289 Other specified depressive episodes: Secondary | ICD-10-CM | POA: Diagnosis not present

## 2018-08-29 DIAGNOSIS — F411 Generalized anxiety disorder: Secondary | ICD-10-CM | POA: Diagnosis not present

## 2018-08-29 DIAGNOSIS — L570 Actinic keratosis: Secondary | ICD-10-CM | POA: Diagnosis not present

## 2018-09-12 DIAGNOSIS — F411 Generalized anxiety disorder: Secondary | ICD-10-CM | POA: Diagnosis not present

## 2018-09-12 DIAGNOSIS — F3289 Other specified depressive episodes: Secondary | ICD-10-CM | POA: Diagnosis not present

## 2018-09-26 DIAGNOSIS — F3289 Other specified depressive episodes: Secondary | ICD-10-CM | POA: Diagnosis not present

## 2018-09-26 DIAGNOSIS — R21 Rash and other nonspecific skin eruption: Secondary | ICD-10-CM | POA: Diagnosis not present

## 2018-09-26 DIAGNOSIS — M25551 Pain in right hip: Secondary | ICD-10-CM | POA: Diagnosis not present

## 2018-09-26 DIAGNOSIS — F411 Generalized anxiety disorder: Secondary | ICD-10-CM | POA: Diagnosis not present

## 2018-10-01 DIAGNOSIS — F3289 Other specified depressive episodes: Secondary | ICD-10-CM | POA: Diagnosis not present

## 2018-10-01 DIAGNOSIS — F411 Generalized anxiety disorder: Secondary | ICD-10-CM | POA: Diagnosis not present

## 2018-10-08 DIAGNOSIS — F411 Generalized anxiety disorder: Secondary | ICD-10-CM | POA: Diagnosis not present

## 2018-10-08 DIAGNOSIS — F3289 Other specified depressive episodes: Secondary | ICD-10-CM | POA: Diagnosis not present

## 2018-10-15 DIAGNOSIS — F3289 Other specified depressive episodes: Secondary | ICD-10-CM | POA: Diagnosis not present

## 2018-10-15 DIAGNOSIS — F411 Generalized anxiety disorder: Secondary | ICD-10-CM | POA: Diagnosis not present

## 2018-10-24 DIAGNOSIS — F3289 Other specified depressive episodes: Secondary | ICD-10-CM | POA: Diagnosis not present

## 2018-10-24 DIAGNOSIS — F411 Generalized anxiety disorder: Secondary | ICD-10-CM | POA: Diagnosis not present

## 2018-10-31 DIAGNOSIS — F3289 Other specified depressive episodes: Secondary | ICD-10-CM | POA: Diagnosis not present

## 2018-10-31 DIAGNOSIS — F411 Generalized anxiety disorder: Secondary | ICD-10-CM | POA: Diagnosis not present

## 2018-10-31 DIAGNOSIS — M545 Low back pain: Secondary | ICD-10-CM | POA: Diagnosis not present

## 2018-10-31 DIAGNOSIS — B0229 Other postherpetic nervous system involvement: Secondary | ICD-10-CM | POA: Diagnosis not present

## 2018-11-08 ENCOUNTER — Ambulatory Visit (HOSPITAL_COMMUNITY): Payer: BLUE CROSS/BLUE SHIELD | Admitting: Psychiatry

## 2018-11-08 ENCOUNTER — Other Ambulatory Visit: Payer: Self-pay

## 2018-11-08 ENCOUNTER — Encounter (HOSPITAL_COMMUNITY): Payer: Self-pay | Admitting: Psychiatry

## 2018-11-08 DIAGNOSIS — F320 Major depressive disorder, single episode, mild: Secondary | ICD-10-CM

## 2018-11-08 MED ORDER — BUSPIRONE HCL 7.5 MG PO TABS: 7.5000 mg | ORAL_TABLET | Freq: Three times a day (TID) | ORAL | 2 refills | Status: DC

## 2018-11-08 MED ORDER — BUPROPION HCL ER (XL) 150 MG PO TB24: 450.0000 mg | ORAL_TABLET | Freq: Every day | ORAL | 4 refills | Status: DC

## 2018-11-08 NOTE — Progress Notes (Unsigned)
BH MD/PA/NP OP Progress Note  11/08/2018 3:52 PM Thomas Kelly  MRN:  [id number]  Chief Complaint:  Chief Complaint    Follow-up; Medication Refill     HPI:    ***Patient reports he is doing well and has no concerns at this time.  His anxiety typically comes on with the beginning in and day of a semester throughout the year.  He is aware of this and is using coping skills.  He feels as though he is better handling these transitions.  He is actively participating in CBT.  His responsibilities at work have changed.  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  He now has full custody of his son until April.  He really feels that the Wellbutrin is helping.  The holidays were mildly stressful so he requested a refill on Xanax but overall felt that the holidays went well.  He has some anxiety in the back of his mind related to work.  He states his depression is mild and manageable.  He has good days.  Even on bad days he uses coping skills and remains active.  He states that neither interfere with his quality of life or interactions or work.  He continues to work out, travel, Loss adjuster, chartered on and off.  Emre has been working on a book for the last decade and expects back/hopes that it will going to publishing this April.  Since he is doing so well he is requesting a 55-month follow-up but states he will call and schedule an earlier appointment if needed.   Visit Diagnosis:  No diagnosis found.  Past Psychiatric History:  Bipolar Disorder:No Depression:Yes Mania:No Psychosis:No Schizophrenia:No Personality Disorder:No Hospitalization for psychiatric illness:No History of Electroconvulsive Shock Therapy:No Prior Suicide Attempts:No    Past Medical History:  Past Medical History:  Diagnosis Date  . Anxiety   . Depression   . GERD (gastroesophageal reflux disease)     Past Surgical History:  Procedure Laterality Date  . VASECTOMY  2014    Family Psychiatric  History:  Family History  Problem Relation Age of Onset  . ADD / ADHD Son   . Bipolar disorder Son   . Alcohol abuse Neg Hx   . Anxiety disorder Neg Hx   . Dementia Neg Hx   . Depression Neg Hx   . Drug abuse Neg Hx   . Schizophrenia Neg Hx   . Suicidality Neg Hx     Social History:  Social History   Socioeconomic History  . Marital status: Married    Spouse name: Not on file  . Number of children: Not on file  . Years of education: Not on file  . Highest education level: Not on file  Occupational History  . Not on file  Social Needs  . Financial resource strain: Not on file  . Food insecurity:    Worry: Not on file    Inability: Not on file  . Transportation needs:    Medical: Not on file    Non-medical: Not on file  Tobacco Use  . Smoking status: Former Smoker    Last attempt to quit: 11/29/2007    Years since quitting: 10.9  . Smokeless tobacco: Never Used  Substance and Sexual Activity  . Alcohol use: Yes    Alcohol/week: 6.0 standard drinks    Types: 6 Shots of liquor per week    Comment: a few times a week  . Drug use: No  . Sexual activity: Yes  Partners: Female    Birth control/protection: None  Lifestyle  . Physical activity:    Days per week: Not on file    Minutes per session: Not on file  . Stress: Not on file  Relationships  . Social connections:    Talks on phone: Not on file    Gets together: Not on file    Attends religious service: Not on file    Active member of club or organization: Not on file    Attends meetings of clubs or organizations: Not on file    Relationship status: Not on file  Other Topics Concern  . Not on file  Social History Narrative  . Not on file    Allergies: No Known Allergies  Metabolic Disorder Labs: No results found for: HGBA1C, MPG No results found for: PROLACTIN No results found for: CHOL, TRIG, HDL, CHOLHDL, VLDL, LDLCALC No results found for: TSH  Therapeutic Level Labs: No results found for:  LITHIUM No results found for: VALPROATE No components found for:  CBMZ  Current Medications: Current Outpatient Medications  Medication Sig Dispense Refill  . ALPRAZolam (XANAX) 0.5 MG tablet Take 1 tablet (0.5 mg total) by mouth daily as needed. 15 tablet 1  . buPROPion (WELLBUTRIN XL) 150 MG 24 hr tablet Take 3 tablets (450 mg total) by mouth daily. 90 tablet 4  . Multiple Vitamin (MULTIVITAMIN) capsule Take 1 capsule by mouth daily.    . Omeprazole 20 MG TBEC Take 20-40 mg by mouth daily.    . pravastatin (PRAVACHOL) 10 MG tablet Take 10 mg by mouth daily.  1   No current facility-administered medications for this visit.      Musculoskeletal: Strength & Muscle Tone: within normal limits Gait & Station: normal Patient leans: N/A  Psychiatric Specialty Exam: Review of Systems  Constitutional: Negative for chills, diaphoresis and fever.  Respiratory: Negative for cough and sputum production.   Cardiovascular: Negative for chest pain, palpitations and leg swelling.    There were no vitals taken for this visit.There is no height or weight on file to calculate BMI.  General Appearance: Fairly Groomed  Eye Contact:  Good  Speech:  Clear and Coherent and Normal Rate  Volume:  Normal  Mood:  Euthymic  Affect:  Full Range  Thought Process:  Goal Directed and Descriptions of Associations: Intact  Orientation:  Full (Time, Place, and Person)  Thought Content:  Logical  Suicidal Thoughts:  No  Homicidal Thoughts:  No  Memory:  Immediate;   Good  Judgement:  Good  Insight:  Good  Psychomotor Activity:  Normal  Concentration:  Concentration: Good  Recall:  Good  Fund of Knowledge:  Good  Language:  Good  Akathisia:  No  Handed:  Right  AIMS (if indicated):     Assets:  Communication Skills Desire for Improvement Financial Resources/Insurance Housing Intimacy Leisure Time Physical Health Resilience Social Support Talents/Skills Transportation Vocational/Educational   ADL's:  Intact  Cognition:  WNL  Sleep:   good       Screenings:  I reviewed the information below on 08/23/2018 and have updated it Assessment and Plan: MDD-recurrent, mild with anxiety    Medication management with supportive therapy. Risks and benefits, side effects and alternative treatment options discussed with patient. Pt was given an opportunity to ask questions about medication, illness, and treatment. All current psychiatric medications have been reviewed and discussed with the patient and adjusted as clinically appropriate. The patient has been provided an accurate and updated  list of the medications being now prescribed. Patient expressed understanding of how their medications were to be used.  Pt verbalized understanding and verbal consent obtained for treatment.  Status of current problems: increased anxiety, depression is stable  Meds: Wellbutrin XL 450gm po qD for MDD D/c Xanax  Start trial of Buspar 7.5mg  po TID for anxiety   Labs: none  Therapy: brief supportive therapy provided. Discussed psychosocial stressors in detail.     Consultations: Encouraged to follow up with therapist-feels that CBT is working well for him Encouraged to follow up with PCP as needed  Pt denies SI and is at an acute low risk for suicide. Patient told to call clinic if any problems occur. Patient advised to go to ER if they should develop SI/HI, side effects, or if symptoms worsen. Has crisis numbers to call if needed. Pt verbalized understanding.  F/up in 6 weeks or sooner if needed  The duration of this appointment visit was 15 minutes of face-to-face time with the patient.  Greater than 50% of this time was spent in counseling, explanation of  diagnosis, planning of further management, and coordination of care   Oletta DarterSalina Nathaly Dawkins, MD 11/08/2018, 3:52 PM

## 2018-11-09 DIAGNOSIS — F411 Generalized anxiety disorder: Secondary | ICD-10-CM | POA: Diagnosis not present

## 2018-11-09 DIAGNOSIS — F3289 Other specified depressive episodes: Secondary | ICD-10-CM | POA: Diagnosis not present

## 2018-11-16 DIAGNOSIS — F3289 Other specified depressive episodes: Secondary | ICD-10-CM | POA: Diagnosis not present

## 2018-11-16 DIAGNOSIS — F411 Generalized anxiety disorder: Secondary | ICD-10-CM | POA: Diagnosis not present

## 2018-11-23 DIAGNOSIS — F3289 Other specified depressive episodes: Secondary | ICD-10-CM | POA: Diagnosis not present

## 2018-11-23 DIAGNOSIS — F411 Generalized anxiety disorder: Secondary | ICD-10-CM | POA: Diagnosis not present

## 2018-11-29 DIAGNOSIS — F411 Generalized anxiety disorder: Secondary | ICD-10-CM | POA: Diagnosis not present

## 2018-11-29 DIAGNOSIS — F3289 Other specified depressive episodes: Secondary | ICD-10-CM | POA: Diagnosis not present

## 2018-12-06 DIAGNOSIS — F3289 Other specified depressive episodes: Secondary | ICD-10-CM | POA: Diagnosis not present

## 2018-12-06 DIAGNOSIS — F411 Generalized anxiety disorder: Secondary | ICD-10-CM | POA: Diagnosis not present

## 2018-12-13 DIAGNOSIS — F3289 Other specified depressive episodes: Secondary | ICD-10-CM | POA: Diagnosis not present

## 2018-12-13 DIAGNOSIS — F411 Generalized anxiety disorder: Secondary | ICD-10-CM | POA: Diagnosis not present

## 2018-12-17 DIAGNOSIS — F411 Generalized anxiety disorder: Secondary | ICD-10-CM | POA: Diagnosis not present

## 2018-12-17 DIAGNOSIS — F3289 Other specified depressive episodes: Secondary | ICD-10-CM | POA: Diagnosis not present

## 2018-12-20 DIAGNOSIS — F3289 Other specified depressive episodes: Secondary | ICD-10-CM | POA: Diagnosis not present

## 2018-12-20 DIAGNOSIS — F411 Generalized anxiety disorder: Secondary | ICD-10-CM | POA: Diagnosis not present

## 2018-12-24 DIAGNOSIS — F3289 Other specified depressive episodes: Secondary | ICD-10-CM | POA: Diagnosis not present

## 2018-12-24 DIAGNOSIS — F411 Generalized anxiety disorder: Secondary | ICD-10-CM | POA: Diagnosis not present

## 2018-12-27 ENCOUNTER — Encounter (HOSPITAL_COMMUNITY): Payer: Self-pay | Admitting: Psychiatry

## 2018-12-27 ENCOUNTER — Ambulatory Visit (INDEPENDENT_AMBULATORY_CARE_PROVIDER_SITE_OTHER): Payer: BC Managed Care – PPO | Admitting: Psychiatry

## 2018-12-27 ENCOUNTER — Other Ambulatory Visit: Payer: Self-pay

## 2018-12-27 DIAGNOSIS — F3289 Other specified depressive episodes: Secondary | ICD-10-CM | POA: Diagnosis not present

## 2018-12-27 DIAGNOSIS — F411 Generalized anxiety disorder: Secondary | ICD-10-CM | POA: Diagnosis not present

## 2018-12-27 DIAGNOSIS — F320 Major depressive disorder, single episode, mild: Secondary | ICD-10-CM | POA: Diagnosis not present

## 2018-12-27 MED ORDER — ALPRAZOLAM 0.5 MG PO TABS: 0.5000 mg | ORAL_TABLET | Freq: Every evening | ORAL | 0 refills | Status: AC | PRN

## 2018-12-27 MED ORDER — BUSPIRONE HCL 7.5 MG PO TABS: 7.5000 mg | ORAL_TABLET | Freq: Three times a day (TID) | ORAL | 2 refills | Status: DC

## 2018-12-27 MED ORDER — BUPROPION HCL ER (XL) 150 MG PO TB24: 450.0000 mg | ORAL_TABLET | Freq: Every day | ORAL | 4 refills | Status: DC

## 2018-12-27 NOTE — Progress Notes (Signed)
Virtual Visit via Telephone Note  I connected with Thomas Kelly on 12/27/18 at  2:15 PM EDT by telephone and verified that I am speaking with the correct person using two identifiers.  Location: Patient: Home Provider: Office   I discussed the limitations, risks, security and privacy concerns of performing an evaluation and management service by telephone and the availability of in person appointments. I also discussed with the patient that there may be a patient responsible charge related to this service. The patient expressed understanding and agreed to proceed.   History of Present Illness: Pt reports he is taking Buspar BID and could not tolerate TID due to GI SE initially.It was helpful but lately he is having a lot of anxiety due to stressors related to "the world" and 1.5 weeks broke up with his girlfriend. Thomas Kelly he was having a lot of anxiety about his relationship so was always worried that he was going to her lose her. It became overwhelming. He has had to take Xanax a few times since the breakup. He notes an increase in his libido lately which he describes as "ill timed". Other stressors include his employment, economic stress, politics, health/viral concerns. He worries about his anxiety and is catastrophizing. Thomas Kelly feels "damaged and broken". Thomas Kelly is in therapy 2x/week now. Today his therapist told him he might benefit from ADHD testing.  Pt wishes he "was not someone who needed to take meds". He is depressed. He feels tired. All the changes in his life due to breaking up and the world have been difficult to deal with. He is doing some hobbies, runs, working and keeping busy. Pt has been reaching out to friends. He can't "self sooth" well. Pt does not like taking Xanax due to the SE.    Observations/Objective: I spoke with Thomas Kelly on the phone.  Pt was calm, pleasant and cooperative.  Pt was engaged in the conversation and answered questions appropriately.  Speech was clear and  coherent with normal rate, tone and volume.  Mood is depressed and anxious, affect is congruent. Thought processes are coherent and intact.  Thought content is with ruminations.  Pt denies SI/HI.   Pt denies auditory and visual hallucinations and did not appear to be responding to internal stimuli.  Memory and concentration are good.  Fund of knowledge and use of language are average.  Insight and judgment are fair.  I am unable to comment on psychomotor activity, general appearance, hygiene, or eye contact as I was unable to physically see the patient on the phone.   Assessment and Plan: MDD-recurrent, mild with anxiety; r/o ADHD  -Wellbutrin XL 450mg  po qD for MDD -Increase Buspar 7.5mg  po TID for MDD and anxiety -Restart Xanax 0.5mg  po qD prn anxiety   Follow Up Instructions: In 6 weeks or sooner if needed   I discussed the assessment and treatment plan with the patient. The patient was provided an opportunity to ask questions and all were answered. The patient agreed with the plan and demonstrated an understanding of the instructions.   The patient was advised to call back or seek an in-person evaluation if the symptoms worsen or if the condition fails to improve as anticipated.  I provided 30 minutes of non-face-to-face time during this encounter.   Charlcie Cradle, MD

## 2018-12-31 DIAGNOSIS — F411 Generalized anxiety disorder: Secondary | ICD-10-CM | POA: Diagnosis not present

## 2018-12-31 DIAGNOSIS — F3289 Other specified depressive episodes: Secondary | ICD-10-CM | POA: Diagnosis not present

## 2019-01-02 DIAGNOSIS — Z0000 Encounter for general adult medical examination without abnormal findings: Secondary | ICD-10-CM | POA: Diagnosis not present

## 2019-01-02 DIAGNOSIS — F338 Other recurrent depressive disorders: Secondary | ICD-10-CM | POA: Diagnosis not present

## 2019-01-02 DIAGNOSIS — L570 Actinic keratosis: Secondary | ICD-10-CM | POA: Diagnosis not present

## 2019-01-02 DIAGNOSIS — E7800 Pure hypercholesterolemia, unspecified: Secondary | ICD-10-CM | POA: Diagnosis not present

## 2019-01-03 DIAGNOSIS — F411 Generalized anxiety disorder: Secondary | ICD-10-CM | POA: Diagnosis not present

## 2019-01-03 DIAGNOSIS — F3289 Other specified depressive episodes: Secondary | ICD-10-CM | POA: Diagnosis not present

## 2019-01-15 DIAGNOSIS — F3289 Other specified depressive episodes: Secondary | ICD-10-CM | POA: Diagnosis not present

## 2019-01-15 DIAGNOSIS — F411 Generalized anxiety disorder: Secondary | ICD-10-CM | POA: Diagnosis not present

## 2019-01-18 DIAGNOSIS — F3289 Other specified depressive episodes: Secondary | ICD-10-CM | POA: Diagnosis not present

## 2019-01-18 DIAGNOSIS — F411 Generalized anxiety disorder: Secondary | ICD-10-CM | POA: Diagnosis not present

## 2019-01-21 DIAGNOSIS — F3289 Other specified depressive episodes: Secondary | ICD-10-CM | POA: Diagnosis not present

## 2019-01-21 DIAGNOSIS — F411 Generalized anxiety disorder: Secondary | ICD-10-CM | POA: Diagnosis not present

## 2019-01-28 DIAGNOSIS — F411 Generalized anxiety disorder: Secondary | ICD-10-CM | POA: Diagnosis not present

## 2019-01-28 DIAGNOSIS — F3289 Other specified depressive episodes: Secondary | ICD-10-CM | POA: Diagnosis not present

## 2019-02-04 DIAGNOSIS — F3289 Other specified depressive episodes: Secondary | ICD-10-CM | POA: Diagnosis not present

## 2019-02-04 DIAGNOSIS — F411 Generalized anxiety disorder: Secondary | ICD-10-CM | POA: Diagnosis not present

## 2019-02-07 ENCOUNTER — Encounter (HOSPITAL_COMMUNITY): Payer: Self-pay | Admitting: Psychiatry

## 2019-02-07 ENCOUNTER — Other Ambulatory Visit: Payer: Self-pay

## 2019-02-07 ENCOUNTER — Ambulatory Visit (INDEPENDENT_AMBULATORY_CARE_PROVIDER_SITE_OTHER): Payer: BC Managed Care – PPO | Admitting: Psychiatry

## 2019-02-07 DIAGNOSIS — F320 Major depressive disorder, single episode, mild: Secondary | ICD-10-CM

## 2019-02-07 DIAGNOSIS — F330 Major depressive disorder, recurrent, mild: Secondary | ICD-10-CM

## 2019-02-07 MED ORDER — BUPROPION HCL ER (XL) 150 MG PO TB24: 450.0000 mg | ORAL_TABLET | Freq: Every day | ORAL | 1 refills | Status: DC

## 2019-02-07 NOTE — Progress Notes (Signed)
Virtual Visit via Telephone Note  I connected with Thomas Kelly on 02/07/19 at  4:30 PM EDT by telephone and verified that I am speaking with the correct person using two identifiers.  Location: Patient: home Provider: office   I discussed the limitations, risks, security and privacy concerns of performing an evaluation and management service by telephone and the availability of in person appointments. I also discussed with the patient that there may be a patient responsible charge related to this service. The patient expressed understanding and agreed to proceed.   History of Present Illness: Pt states he is doing better since our last visit. He is feeling more emotionally stable. He is still dealing with some stuff but it is more manageable. He is busy with projects this summer and that helps with his usual summer depression. [name] is eating healthy and working out.  He is taking the Wellbutrin but ran out yesterday. He is taking Buspar BID most days. He is sleeping better with on/off Melatonin use. Lemont prefers not to take Xanax so takes it only when overwhelmed. At this point due to social circumstances around the country and world he feels his anxiety is appropriate. The anxiety is not as bad as he expected. Planning has always been helpful in managing his anxiety. Pt is proud to be managing his anxiety with behaviors and meds. 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 He is also worried about his job and pay just like everyone else.     Observations/Objective: I spoke with Thomas Kelly on the phone.  Pt was calm, pleasant and cooperative.  Pt was engaged in the conversation and answered questions appropriately.  Speech was clear and coherent with normal rate, tone and volume.  Mood is mildly depressed and anxious, affect is full. Thought processes are coherent, goal oriented and intact.  Thought content is logical.  Pt  denies SI/HI.   Pt denies auditory and visual hallucinations and did not appear to be responding to internal stimuli.  Memory and concentration are good.  Fund of knowledge and use of language are average.  Insight and judgment are fair.  I am unable to comment on psychomotor activity, general appearance, hygiene, or eye contact as I was unable to physically see the patient on the phone.  Vital signs not available since interview conducted virtually.    Assessment and Plan: MDD-recurrent, mild with anxiety; r/o ADHD  Wellbutrin XL 450mg  po qD  Buspar 7.5mg  po TID- usually taking it BID. He is no longer having GI SE. I advised him to start TID dosing when school restarts.   Xanax 0.5mg  po qD prn anxiety- he takes one tab a few times a month or less   Follow Up Instructions: In 2-3 months or sooner if needed   I discussed the assessment and treatment plan with the patient. The patient was provided an opportunity to ask questions and all were answered. The patient agreed with the plan and demonstrated an understanding of the instructions.   The patient was advised to call back or seek an in-person evaluation if the symptoms worsen or if the condition fails to improve as anticipated.  I provided 20 minutes of non-face-to-face time during this encounter.   Charlcie Cradle, MD

## 2019-02-13 DIAGNOSIS — F411 Generalized anxiety disorder: Secondary | ICD-10-CM | POA: Diagnosis not present

## 2019-02-13 DIAGNOSIS — F3289 Other specified depressive episodes: Secondary | ICD-10-CM | POA: Diagnosis not present

## 2019-02-21 ENCOUNTER — Ambulatory Visit (HOSPITAL_COMMUNITY): Payer: BLUE CROSS/BLUE SHIELD | Admitting: Psychiatry

## 2019-02-21 DIAGNOSIS — F3289 Other specified depressive episodes: Secondary | ICD-10-CM | POA: Diagnosis not present

## 2019-02-21 DIAGNOSIS — F411 Generalized anxiety disorder: Secondary | ICD-10-CM | POA: Diagnosis not present

## 2019-03-01 DIAGNOSIS — Z7189 Other specified counseling: Secondary | ICD-10-CM | POA: Diagnosis not present

## 2019-03-01 DIAGNOSIS — Z20828 Contact with and (suspected) exposure to other viral communicable diseases: Secondary | ICD-10-CM | POA: Diagnosis not present

## 2019-03-04 DIAGNOSIS — F411 Generalized anxiety disorder: Secondary | ICD-10-CM | POA: Diagnosis not present

## 2019-03-04 DIAGNOSIS — F3289 Other specified depressive episodes: Secondary | ICD-10-CM | POA: Diagnosis not present

## 2019-03-11 DIAGNOSIS — F411 Generalized anxiety disorder: Secondary | ICD-10-CM | POA: Diagnosis not present

## 2019-03-11 DIAGNOSIS — F3289 Other specified depressive episodes: Secondary | ICD-10-CM | POA: Diagnosis not present

## 2019-03-18 DIAGNOSIS — F411 Generalized anxiety disorder: Secondary | ICD-10-CM | POA: Diagnosis not present

## 2019-03-18 DIAGNOSIS — F3289 Other specified depressive episodes: Secondary | ICD-10-CM | POA: Diagnosis not present

## 2019-03-26 DIAGNOSIS — F411 Generalized anxiety disorder: Secondary | ICD-10-CM | POA: Diagnosis not present

## 2019-03-26 DIAGNOSIS — F3289 Other specified depressive episodes: Secondary | ICD-10-CM | POA: Diagnosis not present

## 2019-03-28 ENCOUNTER — Other Ambulatory Visit: Payer: Self-pay

## 2019-03-28 ENCOUNTER — Ambulatory Visit (INDEPENDENT_AMBULATORY_CARE_PROVIDER_SITE_OTHER): Payer: BC Managed Care – PPO | Admitting: Psychiatry

## 2019-03-28 ENCOUNTER — Encounter (HOSPITAL_COMMUNITY): Payer: Self-pay | Admitting: Psychiatry

## 2019-03-28 DIAGNOSIS — F320 Major depressive disorder, single episode, mild: Secondary | ICD-10-CM | POA: Diagnosis not present

## 2019-03-28 MED ORDER — BUSPIRONE HCL 7.5 MG PO TABS: 7.5000 mg | ORAL_TABLET | Freq: Two times a day (BID) | ORAL | 2 refills | Status: DC

## 2019-03-28 MED ORDER — BUPROPION HCL ER (XL) 150 MG PO TB24: 450.0000 mg | ORAL_TABLET | Freq: Every day | ORAL | 2 refills | Status: DC

## 2019-03-28 NOTE — Progress Notes (Signed)
Virtual Visit via Telephone Note  I connected with Thomas Kelly on 03/28/19 at  9:45 AM EDT by telephone and verified that I am speaking with the correct person using two identifiers.  Location: Patient: home Provider: office   I discussed the limitations, risks, security and privacy concerns of performing an evaluation and management service by telephone and the availability of in person appointments. I also discussed with the patient that there may be a patient responsible charge related to this service. The patient expressed understanding and agreed to proceed.   History of Present Illness: Pt states he is having a lot of anxiety about things that are happening around the world, work, his son. [name] feels it is an appropriate level of anxiety given the current state of global events. He is engaging in weekly therapy. [name] notes that he is having a lot of work stress. 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 This is going to affect his personal life- family, social life and living situation. His depression is ongoing but manageable. He has ups and downs but is overall coping. [name] cries more often lately. At least 2x/week he is depressed or anxious and must leave the house. He is still using coping skills- therapy, exercising, mediation. He is not taking the Xanax.. It helps him sleep but he doesn't like the GI SE. [name] has been drinking a little more but is not concerned that he is abusing alcohol. On 2 occassions he took a Xanax and 2 drinks and could not remember the evening. He didn't go out. It made him uncomfortable so he threw out the Xanax.  He is now taking Melatonin and is sleeping ok. He wakes up at 4am and will fall back asleep after 30 min. [name] is taking the Buspar once a day and sometimes twice a day. He can't remember the afternoon dose. [name] denies SI/HI. He has a good support network.     Observations/Objective: I spoke with Thomas Kelly  on the phone.  Pt was calm, pleasant and cooperative.  Pt was engaged in the conversation and answered questions appropriately.  Speech was clear and coherent with normal rate, tone and volume.  Mood is depressed and anxious, affect is full. Thought processes are coherent, goal oriented and intact.  Thought content is logical.  Pt denies SI/HI.   Pt denies auditory and visual hallucinations and did not appear to be responding to internal stimuli.  Memory and concentration are good.  Fund of knowledge and use of language are average.  Insight and judgment are fair.  I am unable to comment on psychomotor activity, general appearance, hygiene, or eye contact as I was unable to physically see the patient on the phone.  Vital signs not available since interview conducted virtually.     Assessment and Plan: MDD-recurrent, mild with anxiety; r/o ADHD  Wellbutrin XL 450mg  po qD for MDD  Buspar 7.5mg  BID - advised him to take it BID for anxiety and augmentation for depression  D/c Xanax  Status of current symptoms: ongoing symptoms of depression and anxiety     Follow Up Instructions: In 8 weeks or sooner if needed   I discussed the assessment and treatment plan with the patient. The patient was provided an opportunity to ask questions and all were answered. The patient agreed with the plan and demonstrated an understanding of the instructions.   The patient was advised to call back or seek an  in-person evaluation if the symptoms worsen or if the condition fails to improve as anticipated.  I provided 15 minutes of non-face-to-face time during this encounter.   [name], MD

## 2019-04-01 DIAGNOSIS — F411 Generalized anxiety disorder: Secondary | ICD-10-CM | POA: Diagnosis not present

## 2019-04-01 DIAGNOSIS — F3289 Other specified depressive episodes: Secondary | ICD-10-CM | POA: Diagnosis not present

## 2019-04-08 DIAGNOSIS — F411 Generalized anxiety disorder: Secondary | ICD-10-CM | POA: Diagnosis not present

## 2019-04-08 DIAGNOSIS — F3289 Other specified depressive episodes: Secondary | ICD-10-CM | POA: Diagnosis not present

## 2019-04-17 DIAGNOSIS — G4484 Primary exertional headache: Secondary | ICD-10-CM | POA: Diagnosis not present

## 2019-04-17 DIAGNOSIS — F411 Generalized anxiety disorder: Secondary | ICD-10-CM | POA: Diagnosis not present

## 2019-04-17 DIAGNOSIS — M7711 Lateral epicondylitis, right elbow: Secondary | ICD-10-CM | POA: Diagnosis not present

## 2019-04-17 DIAGNOSIS — Z125 Encounter for screening for malignant neoplasm of prostate: Secondary | ICD-10-CM | POA: Diagnosis not present

## 2019-04-17 DIAGNOSIS — K589 Irritable bowel syndrome without diarrhea: Secondary | ICD-10-CM | POA: Diagnosis not present

## 2019-04-17 DIAGNOSIS — F3289 Other specified depressive episodes: Secondary | ICD-10-CM | POA: Diagnosis not present

## 2019-04-17 DIAGNOSIS — Z0000 Encounter for general adult medical examination without abnormal findings: Secondary | ICD-10-CM | POA: Diagnosis not present

## 2019-04-17 DIAGNOSIS — E7800 Pure hypercholesterolemia, unspecified: Secondary | ICD-10-CM | POA: Diagnosis not present

## 2019-04-17 DIAGNOSIS — Z23 Encounter for immunization: Secondary | ICD-10-CM | POA: Diagnosis not present

## 2019-04-17 DIAGNOSIS — Z8249 Family history of ischemic heart disease and other diseases of the circulatory system: Secondary | ICD-10-CM | POA: Diagnosis not present

## 2019-04-17 DIAGNOSIS — R1031 Right lower quadrant pain: Secondary | ICD-10-CM | POA: Diagnosis not present

## 2019-04-24 DIAGNOSIS — F3289 Other specified depressive episodes: Secondary | ICD-10-CM | POA: Diagnosis not present

## 2019-04-24 DIAGNOSIS — F411 Generalized anxiety disorder: Secondary | ICD-10-CM | POA: Diagnosis not present

## 2019-05-01 DIAGNOSIS — F3289 Other specified depressive episodes: Secondary | ICD-10-CM | POA: Diagnosis not present

## 2019-05-01 DIAGNOSIS — F411 Generalized anxiety disorder: Secondary | ICD-10-CM | POA: Diagnosis not present

## 2019-05-06 DIAGNOSIS — Z20828 Contact with and (suspected) exposure to other viral communicable diseases: Secondary | ICD-10-CM | POA: Diagnosis not present

## 2019-05-08 DIAGNOSIS — F3289 Other specified depressive episodes: Secondary | ICD-10-CM | POA: Diagnosis not present

## 2019-05-08 DIAGNOSIS — F411 Generalized anxiety disorder: Secondary | ICD-10-CM | POA: Diagnosis not present

## 2019-05-15 ENCOUNTER — Other Ambulatory Visit: Payer: Self-pay | Admitting: Family Medicine

## 2019-05-15 DIAGNOSIS — G4484 Primary exertional headache: Secondary | ICD-10-CM

## 2019-05-23 ENCOUNTER — Other Ambulatory Visit: Payer: Self-pay

## 2019-05-23 ENCOUNTER — Ambulatory Visit (INDEPENDENT_AMBULATORY_CARE_PROVIDER_SITE_OTHER): Payer: BC Managed Care – PPO | Admitting: Psychiatry

## 2019-05-23 ENCOUNTER — Encounter (HOSPITAL_COMMUNITY): Payer: Self-pay | Admitting: Psychiatry

## 2019-05-23 DIAGNOSIS — F320 Major depressive disorder, single episode, mild: Secondary | ICD-10-CM

## 2019-05-23 MED ORDER — BUPROPION HCL ER (XL) 150 MG PO TB24: 450.0000 mg | ORAL_TABLET | Freq: Every day | ORAL | 2 refills | Status: DC

## 2019-05-23 MED ORDER — BUSPIRONE HCL 7.5 MG PO TABS: 7.5000 mg | ORAL_TABLET | Freq: Every day | ORAL | 1 refills | Status: DC

## 2019-05-23 NOTE — Progress Notes (Signed)
  Virtual Visit via Telephone Note  I connected with Thomas Kelly  on 05/23/19 at  2:00 PM EST by telephone and verified that I am speaking with the correct person using two identifiers.  Location: Patient: work Provider: office   I discussed the limitations, risks, security and privacy concerns of performing an evaluation and management service by telephone and the availability of in person appointments. I also discussed with the patient that there may be a patient responsible charge related to this service. The patient expressed understanding and agreed to proceed.   History of Present Illness: Thomas Kelly reports that he lost his job today. 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 He is having anxiety but it is manageable. It is not as bad as before. The depression is not as bad either. He is still exercising. He is only taking Buspar once a day due to GI SE and potential headaches. He continues to engage in therapy. He does not want meds changed at this time. Thomas Kelly denies SI/HI.     Observations/Objective: I spoke with Thomas Kelly on the phone.  Pt was calm, pleasant and cooperative.  Pt was engaged in the conversation and answered questions appropriately.  Speech was clear and coherent with normal rate, tone and volume.  Mood is depressed and anxious, affect is full. Thought processes are coherent, goal oriented and intact.  Thought content is logical.  Pt denies SI/HI.   Pt denies auditory and visual hallucinations and did not appear to be responding to internal stimuli.  Memory and concentration are good.  Fund of knowledge and use of language are average.  Insight and judgment are fair.  I am unable to comment on psychomotor activity, general appearance, hygiene, or eye contact as I was unable to physically see the patient on the phone.  Vital signs not available since interview conducted virtually.     Assessment and Plan: MDD-recurrent, mild with anxiety; r/o ADHD  Status of current symptoms:  stable  Wellbutrin XL 450mg  po qD for MDD  Buspar 7.5mg  po qD   Xanax prn- pt rarely takes it   Follow Up Instructions: In 6 weeks or sooner if needed   I discussed the assessment and treatment plan with the patient. The patient was provided an opportunity to ask questions and all were answered. The patient agreed with the plan and demonstrated an understanding of the instructions.   The patient was advised to call back or seek an in-person evaluation if the symptoms worsen or if the condition fails to improve as anticipated.  I provided 20  minutes of non-face-to-face time during this encounter.   Charlcie Cradle, MD

## 2019-05-26 ENCOUNTER — Other Ambulatory Visit: Payer: Self-pay

## 2019-05-26 ENCOUNTER — Ambulatory Visit
Admission: RE | Admit: 2019-05-26 | Discharge: 2019-05-26 | Disposition: A | Discharge status: Home / Self Care | Payer: Self-pay | Source: Ambulatory Visit | Attending: Family Medicine | Admitting: Family Medicine

## 2019-05-26 DIAGNOSIS — G4484 Primary exertional headache: Secondary | ICD-10-CM | POA: Diagnosis not present

## 2019-05-26 MED ORDER — GADOBENATE DIMEGLUMINE 529 MG/ML IV SOLN
20.0000 mL | Freq: Once | INTRAVENOUS | Status: AC | PRN
  Administered 2019-05-26: 20 mL via INTRAVENOUS

## 2019-05-31 DIAGNOSIS — F411 Generalized anxiety disorder: Secondary | ICD-10-CM | POA: Diagnosis not present

## 2019-05-31 DIAGNOSIS — F3289 Other specified depressive episodes: Secondary | ICD-10-CM | POA: Diagnosis not present

## 2019-06-05 DIAGNOSIS — F411 Generalized anxiety disorder: Secondary | ICD-10-CM | POA: Diagnosis not present

## 2019-06-05 DIAGNOSIS — F3289 Other specified depressive episodes: Secondary | ICD-10-CM | POA: Diagnosis not present

## 2019-06-17 DIAGNOSIS — F411 Generalized anxiety disorder: Secondary | ICD-10-CM | POA: Diagnosis not present

## 2019-06-17 DIAGNOSIS — F3289 Other specified depressive episodes: Secondary | ICD-10-CM | POA: Diagnosis not present

## 2019-06-24 ENCOUNTER — Other Ambulatory Visit: Payer: Self-pay

## 2019-06-24 DIAGNOSIS — Z20822 Contact with and (suspected) exposure to covid-19: Secondary | ICD-10-CM

## 2019-06-26 DIAGNOSIS — F411 Generalized anxiety disorder: Secondary | ICD-10-CM | POA: Diagnosis not present

## 2019-06-26 DIAGNOSIS — F3289 Other specified depressive episodes: Secondary | ICD-10-CM | POA: Diagnosis not present

## 2019-06-26 LAB — NOVEL CORONAVIRUS, NAA: SARS-CoV-2, NAA: NOT DETECTED

## 2019-06-28 ENCOUNTER — Other Ambulatory Visit: Payer: Self-pay

## 2019-06-28 DIAGNOSIS — Z20822 Contact with and (suspected) exposure to covid-19: Secondary | ICD-10-CM

## 2019-06-30 LAB — NOVEL CORONAVIRUS, NAA: SARS-CoV-2, NAA: NOT DETECTED

## 2019-07-05 DIAGNOSIS — F411 Generalized anxiety disorder: Secondary | ICD-10-CM | POA: Diagnosis not present

## 2019-07-05 DIAGNOSIS — F3289 Other specified depressive episodes: Secondary | ICD-10-CM | POA: Diagnosis not present

## 2019-07-22 DIAGNOSIS — F3289 Other specified depressive episodes: Secondary | ICD-10-CM | POA: Diagnosis not present

## 2019-07-22 DIAGNOSIS — F411 Generalized anxiety disorder: Secondary | ICD-10-CM | POA: Diagnosis not present

## 2019-07-25 ENCOUNTER — Encounter (HOSPITAL_COMMUNITY): Payer: Self-pay | Admitting: Psychiatry

## 2019-07-25 ENCOUNTER — Ambulatory Visit (INDEPENDENT_AMBULATORY_CARE_PROVIDER_SITE_OTHER): Payer: BC Managed Care – PPO | Admitting: Psychiatry

## 2019-07-25 ENCOUNTER — Other Ambulatory Visit: Payer: Self-pay

## 2019-07-25 ENCOUNTER — Telehealth (HOSPITAL_COMMUNITY): Payer: Self-pay | Admitting: Psychiatry

## 2019-07-25 DIAGNOSIS — F320 Major depressive disorder, single episode, mild: Secondary | ICD-10-CM | POA: Diagnosis not present

## 2019-07-25 MED ORDER — BUPROPION HCL ER (XL) 150 MG PO TB24: 450.0000 mg | ORAL_TABLET | Freq: Every day | ORAL | 3 refills | Status: DC

## 2019-07-25 MED ORDER — BUSPIRONE HCL 7.5 MG PO TABS: 7.5000 mg | ORAL_TABLET | Freq: Every day | ORAL | 3 refills | Status: DC

## 2019-07-25 NOTE — Progress Notes (Signed)
  Virtual Visit via Telephone Note  I connected with Thomas Kelly  on 07/25/19 at  4:15 PM EST by telephone and verified that I am speaking with the correct person using two identifiers.  Location: Patient: home Provider: office   I discussed the limitations, risks, security and privacy concerns of performing an evaluation and management service by telephone and the availability of in person appointments. I also discussed with the patient that there may be a patient responsible charge related to this service. The patient expressed understanding and agreed to proceed.   History of Present Illness: He is having a lot of mixed emotions. Yesterday his letter of termination was rescinded. So he will not be terminated in May. [name] is having anger, anxiety, hope and other things. He has been looking for other jobs but is glad he will not be terminated this May. Mood has been "fine" given all that is going on. He has experienced some intense sadness that comes on randomly. [name] is social and actively using social supports. He is still active and engaging in his hobbies. He denies SI/HI.     Observations/Objective: I spoke with Thomas Kelly on the phone.  Pt was calm, pleasant and cooperative.  Pt was engaged in the conversation and answered questions appropriately.  Speech was clear and coherent with normal rate, tone and volume.  Mood is euthymic and affect is full. Thought processes are coherent, goal oriented and intact.  Thought content is logical.  Pt denies SI/HI.   Pt denies auditory and visual hallucinations and did not appear to be responding to internal stimuli.  Memory and concentration are good.  Fund of knowledge and use of language are average.  Insight and judgment are fair.  I am unable to comment on psychomotor activity, general appearance, hygiene, or eye contact as I was unable to physically see the patient on the phone.  Vital signs not available since interview conducted virtually.      Assessment and Plan: MDD-recurrent, mild with anxiety; r/o ADHD  Status of current symptoms: stable  Wellbutrin XL 450mg  po qD for MDD  Buspar 7.5mg  po qD for anxiety  Xanax prn- Inmer rarely takes it  Follow Up Instructions: In 12 weeks or sooner if needed   I discussed the assessment and treatment plan with the patient. The patient was provided an opportunity to ask questions and all were answered. The patient agreed with the plan and demonstrated an understanding of the instructions.   The patient was advised to call back or seek an in-person evaluation if the symptoms worsen or if the condition fails to improve as anticipated.  I provided 15 minutes of non-face-to-face time during this encounter.   , MD

## 2019-07-29 DIAGNOSIS — F3289 Other specified depressive episodes: Secondary | ICD-10-CM | POA: Diagnosis not present

## 2019-07-29 DIAGNOSIS — F411 Generalized anxiety disorder: Secondary | ICD-10-CM | POA: Diagnosis not present

## 2019-08-05 ENCOUNTER — Ambulatory Visit: Payer: BC Managed Care – PPO | Attending: Internal Medicine

## 2019-08-05 DIAGNOSIS — F411 Generalized anxiety disorder: Secondary | ICD-10-CM | POA: Diagnosis not present

## 2019-08-05 DIAGNOSIS — Z20822 Contact with and (suspected) exposure to covid-19: Secondary | ICD-10-CM | POA: Diagnosis not present

## 2019-08-05 DIAGNOSIS — F3289 Other specified depressive episodes: Secondary | ICD-10-CM | POA: Diagnosis not present

## 2019-08-06 LAB — NOVEL CORONAVIRUS, NAA: SARS-CoV-2, NAA: NOT DETECTED

## 2019-08-13 ENCOUNTER — Other Ambulatory Visit (HOSPITAL_COMMUNITY): Payer: Self-pay | Admitting: Psychiatry

## 2019-08-13 DIAGNOSIS — F320 Major depressive disorder, single episode, mild: Secondary | ICD-10-CM

## 2019-08-27 DIAGNOSIS — F3289 Other specified depressive episodes: Secondary | ICD-10-CM | POA: Diagnosis not present

## 2019-08-27 DIAGNOSIS — F411 Generalized anxiety disorder: Secondary | ICD-10-CM | POA: Diagnosis not present

## 2019-09-03 DIAGNOSIS — F3289 Other specified depressive episodes: Secondary | ICD-10-CM | POA: Diagnosis not present

## 2019-09-03 DIAGNOSIS — F411 Generalized anxiety disorder: Secondary | ICD-10-CM | POA: Diagnosis not present

## 2019-09-10 DIAGNOSIS — Z20828 Contact with and (suspected) exposure to other viral communicable diseases: Secondary | ICD-10-CM | POA: Diagnosis not present

## 2019-09-10 DIAGNOSIS — Z7189 Other specified counseling: Secondary | ICD-10-CM | POA: Diagnosis not present

## 2019-09-12 ENCOUNTER — Other Ambulatory Visit: Payer: Self-pay

## 2019-09-12 ENCOUNTER — Ambulatory Visit: Payer: BC Managed Care – PPO | Attending: Internal Medicine

## 2019-09-12 DIAGNOSIS — Z23 Encounter for immunization: Secondary | ICD-10-CM | POA: Insufficient documentation

## 2019-09-12 NOTE — Progress Notes (Signed)
   Covid-19 Vaccination Clinic  Name:  Thomas Kelly    MRN: [id number] DOB: 04-05-1971  09/12/2019  Thomas Kelly was observed post Covid-19 immunization for 15 minutes without incidence. He was provided with Vaccine Information Sheet and instruction to access the V-Safe system.   Thomas Kelly was instructed to call 911 with any severe reactions post vaccine: Marland Kitchen Difficulty breathing  . Swelling of your face and throat  . A fast heartbeat  . A bad rash all over your body  . Dizziness and weakness    Immunizations Administered    Name Date Dose VIS Date Route   Moderna COVID-19 Vaccine 09/12/2019 12:07 PM 0.5 mL 06/18/2019 Intramuscular   Manufacturer: Moderna   Lot: [id number]   NDC: 75170-017-49

## 2019-09-17 DIAGNOSIS — F411 Generalized anxiety disorder: Secondary | ICD-10-CM | POA: Diagnosis not present

## 2019-09-17 DIAGNOSIS — F3289 Other specified depressive episodes: Secondary | ICD-10-CM | POA: Diagnosis not present

## 2019-09-24 DIAGNOSIS — F3289 Other specified depressive episodes: Secondary | ICD-10-CM | POA: Diagnosis not present

## 2019-09-24 DIAGNOSIS — F411 Generalized anxiety disorder: Secondary | ICD-10-CM | POA: Diagnosis not present

## 2019-10-01 DIAGNOSIS — Z7189 Other specified counseling: Secondary | ICD-10-CM | POA: Diagnosis not present

## 2019-10-01 DIAGNOSIS — Z20828 Contact with and (suspected) exposure to other viral communicable diseases: Secondary | ICD-10-CM | POA: Diagnosis not present

## 2019-10-08 DIAGNOSIS — F411 Generalized anxiety disorder: Secondary | ICD-10-CM | POA: Diagnosis not present

## 2019-10-08 DIAGNOSIS — F3289 Other specified depressive episodes: Secondary | ICD-10-CM | POA: Diagnosis not present

## 2019-10-15 ENCOUNTER — Ambulatory Visit: Payer: BC Managed Care – PPO | Attending: Family

## 2019-10-15 DIAGNOSIS — Z23 Encounter for immunization: Secondary | ICD-10-CM

## 2019-10-15 NOTE — Progress Notes (Signed)
   Covid-19 Vaccination Clinic  Name:  Thomas Kelly    MRN: [id number] DOB: 1970-10-06  10/15/2019  [name] was observed post Covid-19 immunization for 15 minutes without incident. He was provided with Vaccine Information Sheet and instruction to access the V-Safe system.   [name] was instructed to call 911 with any severe reactions post vaccine: Marland Kitchen Difficulty breathing  . Swelling of face and throat  . A fast heartbeat  . A bad rash all over body  . Dizziness and weakness   Immunizations Administered    Name Date Dose VIS Date Route   Moderna COVID-19 Vaccine 10/15/2019  9:34 AM 0.5 mL 06/18/2019 Intramuscular   Manufacturer: Moderna   Lot: [id number]   NDC: 21031-281-18

## 2019-10-22 DIAGNOSIS — Z03818 Encounter for observation for suspected exposure to other biological agents ruled out: Secondary | ICD-10-CM | POA: Diagnosis not present

## 2019-10-24 ENCOUNTER — Other Ambulatory Visit: Payer: Self-pay

## 2019-10-24 ENCOUNTER — Ambulatory Visit (INDEPENDENT_AMBULATORY_CARE_PROVIDER_SITE_OTHER): Payer: BC Managed Care – PPO | Admitting: Psychiatry

## 2019-10-24 ENCOUNTER — Encounter (HOSPITAL_COMMUNITY): Payer: Self-pay | Admitting: Psychiatry

## 2019-10-24 DIAGNOSIS — F330 Major depressive disorder, recurrent, mild: Secondary | ICD-10-CM

## 2019-10-24 DIAGNOSIS — F320 Major depressive disorder, single episode, mild: Secondary | ICD-10-CM

## 2019-10-24 DIAGNOSIS — F411 Generalized anxiety disorder: Secondary | ICD-10-CM | POA: Diagnosis not present

## 2019-10-24 DIAGNOSIS — F3289 Other specified depressive episodes: Secondary | ICD-10-CM | POA: Diagnosis not present

## 2019-10-24 MED ORDER — BUPROPION HCL ER (XL) 150 MG PO TB24: ORAL_TABLET | ORAL | 3 refills | Status: DC

## 2019-10-24 MED ORDER — BUSPIRONE HCL 7.5 MG PO TABS: 7.5000 mg | ORAL_TABLET | Freq: Every day | ORAL | 3 refills | Status: DC

## 2019-10-24 NOTE — Progress Notes (Signed)
Virtual Visit via Telephone Note  I connected with Thomas Kelly on 10/24/19 at  4:00 PM EDT by telephone and verified that I am speaking with the correct person using two identifiers.  Location: Patient: home Provider: office   I discussed the limitations, risks, security and privacy concerns of performing an evaluation and management service by telephone and the availability of in person appointments. I also discussed with the patient that there may be a patient responsible charge related to this service. The patient expressed understanding and agreed to proceed.   History of Present Illness: Thomas Kelly states he is doing well. He is staying at [facility] and is relieved to be keeping his job. Thomas Kelly feels his depression and anxiety are well controlled. He remains active and is more social. Thomas Kelly will be traveling some in the next few months. He denies SI/HI. At some point he may stop taking the Buspar. Due to the low dose he wonders if it is doing anything. He is going to think about it    Observations/Objective:  General Appearance: unable to assess  Eye Contact:  unable to assess  Speech:  Clear and Coherent and Normal Rate  Volume:  Normal  Mood:  Euthymic  Affect:  Full Range  Thought Process:  Goal Directed, Linear and Descriptions of Associations: Intact  Orientation:  Full (Time, Place, and Person)  Thought Content:  Logical  Suicidal Thoughts:  No  Homicidal Thoughts:  No  Memory:  Immediate;   Good  Judgement:  Good  Insight:  Good  Psychomotor Activity: unable to assess  Concentration:  Concentration: Good  Recall:  Good  Fund of Knowledge:  Good  Language:  Good  Akathisia:  unable to assess  Handed:  Right  AIMS (if indicated):     Assets:  Communication Skills Desire for Improvement Financial Resources/Insurance Housing Leisure Time Physical Health Resilience Social Support Talents/Skills Transportation Vocational/Educational  ADL's:  unable to assess   Cognition:  WNL  Sleep:         Assessment and Plan: MDD-recurrent, mild with anxiety; r/o ADHD  Status of current symptoms: stable  Wellbutrin XL 450mg  po qD for MDD  Buspar 7.5mg  po qD for anxiety  Xanax prn- rarely use  He continues to engage in therapy  Follow Up Instructions: In 3 months or sooner if needed   I discussed the assessment and treatment plan with the patient. The patient was provided an opportunity to ask questions and all were answered. The patient agreed with the plan and demonstrated an understanding of the instructions.   The patient was advised to call back or seek an in-person evaluation if the symptoms worsen or if the condition fails to improve as anticipated.  I provided 15 minutes of non-face-to-face time during this encounter.   , MD

## 2019-11-04 DIAGNOSIS — F3289 Other specified depressive episodes: Secondary | ICD-10-CM | POA: Diagnosis not present

## 2019-11-04 DIAGNOSIS — F411 Generalized anxiety disorder: Secondary | ICD-10-CM | POA: Diagnosis not present

## 2019-11-05 DIAGNOSIS — Z03818 Encounter for observation for suspected exposure to other biological agents ruled out: Secondary | ICD-10-CM | POA: Diagnosis not present

## 2019-11-13 DIAGNOSIS — F411 Generalized anxiety disorder: Secondary | ICD-10-CM | POA: Diagnosis not present

## 2019-11-13 DIAGNOSIS — F3289 Other specified depressive episodes: Secondary | ICD-10-CM | POA: Diagnosis not present

## 2019-11-27 DIAGNOSIS — F3289 Other specified depressive episodes: Secondary | ICD-10-CM | POA: Diagnosis not present

## 2019-11-27 DIAGNOSIS — F411 Generalized anxiety disorder: Secondary | ICD-10-CM | POA: Diagnosis not present

## 2019-12-09 DIAGNOSIS — F3289 Other specified depressive episodes: Secondary | ICD-10-CM | POA: Diagnosis not present

## 2019-12-09 DIAGNOSIS — F411 Generalized anxiety disorder: Secondary | ICD-10-CM | POA: Diagnosis not present

## 2019-12-24 DIAGNOSIS — F3289 Other specified depressive episodes: Secondary | ICD-10-CM | POA: Diagnosis not present

## 2019-12-24 DIAGNOSIS — F411 Generalized anxiety disorder: Secondary | ICD-10-CM | POA: Diagnosis not present

## 2020-01-02 DIAGNOSIS — F411 Generalized anxiety disorder: Secondary | ICD-10-CM | POA: Diagnosis not present

## 2020-01-02 DIAGNOSIS — F3289 Other specified depressive episodes: Secondary | ICD-10-CM | POA: Diagnosis not present

## 2020-01-17 DIAGNOSIS — F411 Generalized anxiety disorder: Secondary | ICD-10-CM | POA: Diagnosis not present

## 2020-01-17 DIAGNOSIS — F3289 Other specified depressive episodes: Secondary | ICD-10-CM | POA: Diagnosis not present

## 2020-01-23 ENCOUNTER — Encounter (HOSPITAL_COMMUNITY): Payer: Self-pay | Admitting: Psychiatry

## 2020-01-23 ENCOUNTER — Other Ambulatory Visit: Payer: Self-pay

## 2020-01-23 ENCOUNTER — Telehealth (INDEPENDENT_AMBULATORY_CARE_PROVIDER_SITE_OTHER): Payer: BC Managed Care – PPO | Admitting: Psychiatry

## 2020-01-23 DIAGNOSIS — F320 Major depressive disorder, single episode, mild: Secondary | ICD-10-CM | POA: Diagnosis not present

## 2020-01-23 MED ORDER — BUSPIRONE HCL 7.5 MG PO TABS: 7.5000 mg | ORAL_TABLET | Freq: Every day | ORAL | 3 refills | Status: DC

## 2020-01-23 MED ORDER — BUPROPION HCL ER (XL) 150 MG PO TB24: ORAL_TABLET | ORAL | 3 refills | Status: DC

## 2020-01-23 NOTE — Progress Notes (Signed)
Virtual Visit via Telephone Note  I connected with Thomas Kelly on 01/23/20 at  3:30 PM EDT by telephone and verified that I am speaking with the correct person using two identifiers.  Location: Patient: at his son's house Provider: office   I discussed the limitations, risks, security and privacy concerns of performing an evaluation and management service by telephone and the availability of in person appointments. I also discussed with the patient that there may be a patient responsible charge related to this service. The patient expressed understanding and agreed to proceed.   History of Present Illness: Thomas Kelly reports he is doing well. He has been traveling and enjoying himself. It is good to keep himself busy. 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 Thomas Kelly is feeling pretty good and is stressed but he is handling it well. His depression is mild and manageable. Sleep and appetite are good. He denies SI/HI. His anxiety is related situational stressors and the level seems appropriate to the stress.    Observations/Objective:  General Appearance: unable to assess  Eye Contact:  unable to assess  Speech:  Clear and Coherent and Normal Rate  Volume:  Normal  Mood:  Euthymic  Affect:  Full Range  Thought Process:  Goal Directed, Linear and Descriptions of Associations: Intact  Orientation:  Full (Time, Place, and Person)  Thought Content:  Logical  Suicidal Thoughts:  No  Homicidal Thoughts:  No  Memory:  Immediate;   Good  Judgement:  Good  Insight:  Good  Psychomotor Activity: unable to assess  Concentration:  Concentration: Good  Recall:  Good  Fund of Knowledge:  Good  Language:  Good  Akathisia:  unable to assess  Handed:  Right  AIMS (if indicated):     Assets:  Communication Skills Desire for Improvement Financial Resources/Insurance Housing Leisure Time Physical Health Resilience Social  Support Talents/Skills Transportation Vocational/Educational  ADL's:  unable to assess  Cognition:  WNL  Sleep:         Assessment and Plan: MDD-recurrent, mild with anxiety; rule out ADHD  Status of current symptoms: Stable  Wellbutrin XL 450 mg p.o. daily for MDD  BuSpar 7.5 mg p.o. daily for anxiety and augmentation of depression symptoms  Xanax as needed-rarely uses, no refill today  He continues to engage in therapy and has a good support network   Follow Up Instructions: In 2 to 3 months or sooner if needed   I discussed the assessment and treatment plan with the patient. The patient was provided an opportunity to ask questions and all were answered. The patient agreed with the plan and demonstrated an understanding of the instructions.   The patient was advised to call back or seek an in-person evaluation if the symptoms worsen or if the condition fails to improve as anticipated.  I provided 10 minutes of non-face-to-face time during this encounter.   Oletta Darter, MD

## 2020-02-10 DIAGNOSIS — F411 Generalized anxiety disorder: Secondary | ICD-10-CM | POA: Diagnosis not present

## 2020-02-10 DIAGNOSIS — F3289 Other specified depressive episodes: Secondary | ICD-10-CM | POA: Diagnosis not present

## 2020-02-20 DIAGNOSIS — F3289 Other specified depressive episodes: Secondary | ICD-10-CM | POA: Diagnosis not present

## 2020-02-20 DIAGNOSIS — F411 Generalized anxiety disorder: Secondary | ICD-10-CM | POA: Diagnosis not present

## 2020-02-22 DIAGNOSIS — Z03818 Encounter for observation for suspected exposure to other biological agents ruled out: Secondary | ICD-10-CM | POA: Diagnosis not present

## 2020-02-26 DIAGNOSIS — Z03818 Encounter for observation for suspected exposure to other biological agents ruled out: Secondary | ICD-10-CM | POA: Diagnosis not present

## 2020-02-27 DIAGNOSIS — Z03818 Encounter for observation for suspected exposure to other biological agents ruled out: Secondary | ICD-10-CM | POA: Diagnosis not present

## 2020-03-17 DIAGNOSIS — Z03818 Encounter for observation for suspected exposure to other biological agents ruled out: Secondary | ICD-10-CM | POA: Diagnosis not present

## 2020-03-18 DIAGNOSIS — Z03818 Encounter for observation for suspected exposure to other biological agents ruled out: Secondary | ICD-10-CM | POA: Diagnosis not present

## 2020-03-26 ENCOUNTER — Other Ambulatory Visit: Payer: Self-pay

## 2020-03-26 ENCOUNTER — Telehealth (HOSPITAL_COMMUNITY): Payer: BC Managed Care – PPO | Admitting: Psychiatry

## 2020-04-06 DIAGNOSIS — F3289 Other specified depressive episodes: Secondary | ICD-10-CM | POA: Diagnosis not present

## 2020-04-06 DIAGNOSIS — F411 Generalized anxiety disorder: Secondary | ICD-10-CM | POA: Diagnosis not present

## 2020-04-21 DIAGNOSIS — Z03818 Encounter for observation for suspected exposure to other biological agents ruled out: Secondary | ICD-10-CM | POA: Diagnosis not present

## 2020-04-22 DIAGNOSIS — F3289 Other specified depressive episodes: Secondary | ICD-10-CM | POA: Diagnosis not present

## 2020-04-22 DIAGNOSIS — F411 Generalized anxiety disorder: Secondary | ICD-10-CM | POA: Diagnosis not present

## 2020-05-20 DIAGNOSIS — Z03818 Encounter for observation for suspected exposure to other biological agents ruled out: Secondary | ICD-10-CM | POA: Diagnosis not present

## 2020-06-17 DIAGNOSIS — Z03818 Encounter for observation for suspected exposure to other biological agents ruled out: Secondary | ICD-10-CM | POA: Diagnosis not present

## 2020-07-02 ENCOUNTER — Telehealth (HOSPITAL_COMMUNITY): Payer: BC Managed Care – PPO | Admitting: Psychiatry

## 2020-07-02 ENCOUNTER — Other Ambulatory Visit: Payer: Self-pay

## 2020-07-02 DIAGNOSIS — F320 Major depressive disorder, single episode, mild: Secondary | ICD-10-CM

## 2020-07-02 MED ORDER — BUSPIRONE HCL 7.5 MG PO TABS: 7.5000 mg | ORAL_TABLET | Freq: Every day | ORAL | 1 refills | Status: DC

## 2020-07-02 MED ORDER — BUPROPION HCL ER (XL) 150 MG PO TB24: 450.0000 mg | ORAL_TABLET | Freq: Every day | ORAL | 1 refills | Status: DC

## 2020-07-02 NOTE — Progress Notes (Unsigned)
Virtual Visit via Telephone Note  I connected with [name] on 07/02/20 at  4:30 PM EST by telephone and verified that I am speaking with the correct person using two identifiers.  Location: Patient: *** Provider: ***   I discussed the limitations, risks, security and privacy concerns of performing an evaluation and management service by telephone and the availability of in person appointments. I also discussed with the patient that there may be a patient responsible charge related to this service. The patient expressed understanding and agreed to proceed.   History of Present Illness: Thomas Kelly is doing well. His depression and anxiety are manageable. He remains active, productive and social. He denies SI/HI. If things remain stable he would like to start weaning down Wellbutrin this summer.    Observations/Objective:  General Appearance: unable to assess  Eye Contact:  unable to assess  Speech:  {Speech:22685}  Volume:  {Volume (PAA):22686}  Mood:  {BHH MOOD:22306}  Affect:  {Affect (PAA):22687}  Thought Process:  {Thought Process (PAA):22688}  Orientation:  {BHH ORIENTATION (PAA):22689}  Thought Content:  {Thought Content:22690}  Suicidal Thoughts:  {ST/HT (PAA):22692}  Homicidal Thoughts:  {ST/HT (PAA):22692}  Memory:  {BHH MEMORY:22881}  Judgement:  {Judgement (PAA):22694}  Insight:  {Insight (PAA):22695}  Psychomotor Activity: unable to assess  Concentration:  {Concentration:21399}  Recall:  {BHH GOOD/FAIR/POOR:22877}  Fund of Knowledge:  {BHH GOOD/FAIR/POOR:22877}  Language:  {BHH GOOD/FAIR/POOR:22877}  Akathisia:  unable to assess  Handed:  {Handed:22697}  AIMS (if indicated):     Assets:  {Assets (PAA):22698}  ADL's:  unable to assess  Cognition:  {chl bhh cognition:304700322}  Sleep:         Assessment and Plan: 1. Major depressive disorder, single episode, mild (HCC) - buPROPion (WELLBUTRIN XL) 150 MG 24 hr tablet; Take 3 tablets (450 mg total) by mouth  daily.  Dispense: 270 tablet; Refill: 1 - busPIRone (BUSPAR) 7.5 MG tablet; Take 1 tablet (7.5 mg total) by mouth daily.  Dispense: 90 tablet; Refill: 1    Follow Up Instructions: In 5-6 months or sooner if needed   I discussed the assessment and treatment plan with the patient. The patient was provided an opportunity to ask questions and all were answered. The patient agreed with the plan and demonstrated an understanding of the instructions.   The patient was advised to call back or seek an in-person evaluation if the symptoms worsen or if the condition fails to improve as anticipated.  I provided 10 minutes of non-face-to-face time during this encounter.   Oletta Darter, MD

## 2020-07-05 DIAGNOSIS — Z20822 Contact with and (suspected) exposure to covid-19: Secondary | ICD-10-CM | POA: Diagnosis not present

## 2020-07-09 DIAGNOSIS — Z20822 Contact with and (suspected) exposure to covid-19: Secondary | ICD-10-CM | POA: Diagnosis not present
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
## 2020-07-28 DIAGNOSIS — U071 COVID-19: Secondary | ICD-10-CM | POA: Diagnosis not present

## 2020-07-28 DIAGNOSIS — Z20822 Contact with and (suspected) exposure to covid-19: Secondary | ICD-10-CM | POA: Diagnosis not present

## 2020-07-29 DIAGNOSIS — F3289 Other specified depressive episodes: Secondary | ICD-10-CM | POA: Diagnosis not present

## 2020-07-29 DIAGNOSIS — F411 Generalized anxiety disorder: Secondary | ICD-10-CM | POA: Diagnosis not present

## 2020-08-12 DIAGNOSIS — F3289 Other specified depressive episodes: Secondary | ICD-10-CM | POA: Diagnosis not present

## 2020-08-12 DIAGNOSIS — F411 Generalized anxiety disorder: Secondary | ICD-10-CM | POA: Diagnosis not present

## 2020-08-26 DIAGNOSIS — F411 Generalized anxiety disorder: Secondary | ICD-10-CM | POA: Diagnosis not present

## 2020-08-26 DIAGNOSIS — F3289 Other specified depressive episodes: Secondary | ICD-10-CM | POA: Diagnosis not present

## 2020-09-22 DIAGNOSIS — F411 Generalized anxiety disorder: Secondary | ICD-10-CM | POA: Diagnosis not present

## 2020-09-22 DIAGNOSIS — F3289 Other specified depressive episodes: Secondary | ICD-10-CM | POA: Diagnosis not present

## 2020-09-30 DIAGNOSIS — Z20822 Contact with and (suspected) exposure to covid-19: Secondary | ICD-10-CM | POA: Diagnosis not present

## 2020-10-07 DIAGNOSIS — F411 Generalized anxiety disorder: Secondary | ICD-10-CM | POA: Diagnosis not present

## 2020-10-07 DIAGNOSIS — F3289 Other specified depressive episodes: Secondary | ICD-10-CM | POA: Diagnosis not present

## 2020-10-14 DIAGNOSIS — F3289 Other specified depressive episodes: Secondary | ICD-10-CM | POA: Diagnosis not present

## 2020-10-14 DIAGNOSIS — F411 Generalized anxiety disorder: Secondary | ICD-10-CM | POA: Diagnosis not present

## 2020-10-21 DIAGNOSIS — M9905 Segmental and somatic dysfunction of pelvic region: Secondary | ICD-10-CM | POA: Diagnosis not present

## 2020-10-21 DIAGNOSIS — M5137 Other intervertebral disc degeneration, lumbosacral region: Secondary | ICD-10-CM | POA: Diagnosis not present

## 2020-10-21 DIAGNOSIS — M5386 Other specified dorsopathies, lumbar region: Secondary | ICD-10-CM | POA: Diagnosis not present

## 2020-10-21 DIAGNOSIS — M9903 Segmental and somatic dysfunction of lumbar region: Secondary | ICD-10-CM | POA: Diagnosis not present

## 2020-10-27 DIAGNOSIS — Z03818 Encounter for observation for suspected exposure to other biological agents ruled out: Secondary | ICD-10-CM | POA: Diagnosis not present

## 2020-10-28 DIAGNOSIS — M5137 Other intervertebral disc degeneration, lumbosacral region: Secondary | ICD-10-CM | POA: Diagnosis not present

## 2020-10-28 DIAGNOSIS — M9903 Segmental and somatic dysfunction of lumbar region: Secondary | ICD-10-CM | POA: Diagnosis not present

## 2020-10-28 DIAGNOSIS — M9905 Segmental and somatic dysfunction of pelvic region: Secondary | ICD-10-CM | POA: Diagnosis not present

## 2020-10-28 DIAGNOSIS — M5386 Other specified dorsopathies, lumbar region: Secondary | ICD-10-CM | POA: Diagnosis not present

## 2020-11-11 DIAGNOSIS — F3289 Other specified depressive episodes: Secondary | ICD-10-CM | POA: Diagnosis not present

## 2020-11-11 DIAGNOSIS — M5386 Other specified dorsopathies, lumbar region: Secondary | ICD-10-CM | POA: Diagnosis not present

## 2020-11-11 DIAGNOSIS — F411 Generalized anxiety disorder: Secondary | ICD-10-CM | POA: Diagnosis not present

## 2020-11-11 DIAGNOSIS — M9905 Segmental and somatic dysfunction of pelvic region: Secondary | ICD-10-CM | POA: Diagnosis not present

## 2020-11-11 DIAGNOSIS — M9903 Segmental and somatic dysfunction of lumbar region: Secondary | ICD-10-CM | POA: Diagnosis not present

## 2020-11-11 DIAGNOSIS — M5137 Other intervertebral disc degeneration, lumbosacral region: Secondary | ICD-10-CM | POA: Diagnosis not present

## 2020-11-18 DIAGNOSIS — M5137 Other intervertebral disc degeneration, lumbosacral region: Secondary | ICD-10-CM | POA: Diagnosis not present

## 2020-11-18 DIAGNOSIS — M9903 Segmental and somatic dysfunction of lumbar region: Secondary | ICD-10-CM | POA: Diagnosis not present

## 2020-11-18 DIAGNOSIS — M5386 Other specified dorsopathies, lumbar region: Secondary | ICD-10-CM | POA: Diagnosis not present

## 2020-11-18 DIAGNOSIS — M9905 Segmental and somatic dysfunction of pelvic region: Secondary | ICD-10-CM | POA: Diagnosis not present

## 2020-11-24 DIAGNOSIS — Z20822 Contact with and (suspected) exposure to covid-19: Secondary | ICD-10-CM | POA: Diagnosis not present

## 2020-11-25 DIAGNOSIS — F411 Generalized anxiety disorder: Secondary | ICD-10-CM | POA: Diagnosis not present

## 2020-11-25 DIAGNOSIS — F3289 Other specified depressive episodes: Secondary | ICD-10-CM | POA: Diagnosis not present

## 2020-11-27 DIAGNOSIS — Z20822 Contact with and (suspected) exposure to covid-19: Secondary | ICD-10-CM | POA: Diagnosis not present

## 2020-12-02 DIAGNOSIS — M5386 Other specified dorsopathies, lumbar region: Secondary | ICD-10-CM | POA: Diagnosis not present

## 2020-12-02 DIAGNOSIS — M9903 Segmental and somatic dysfunction of lumbar region: Secondary | ICD-10-CM | POA: Diagnosis not present

## 2020-12-02 DIAGNOSIS — M5137 Other intervertebral disc degeneration, lumbosacral region: Secondary | ICD-10-CM | POA: Diagnosis not present

## 2020-12-02 DIAGNOSIS — M9905 Segmental and somatic dysfunction of pelvic region: Secondary | ICD-10-CM | POA: Diagnosis not present

## 2020-12-03 ENCOUNTER — Telehealth (INDEPENDENT_AMBULATORY_CARE_PROVIDER_SITE_OTHER): Payer: BC Managed Care – PPO | Admitting: Psychiatry

## 2020-12-03 ENCOUNTER — Other Ambulatory Visit: Payer: Self-pay

## 2020-12-03 DIAGNOSIS — F320 Major depressive disorder, single episode, mild: Secondary | ICD-10-CM | POA: Diagnosis not present

## 2020-12-03 MED ORDER — BUPROPION HCL ER (XL) 150 MG PO TB24: 450.0000 mg | ORAL_TABLET | Freq: Every day | ORAL | 1 refills | Status: DC

## 2020-12-03 MED ORDER — BUSPIRONE HCL 7.5 MG PO TABS: 7.5000 mg | ORAL_TABLET | Freq: Every day | ORAL | 1 refills | Status: DC

## 2020-12-03 NOTE — Progress Notes (Signed)
Virtual Visit via Telephone Note  I connected with Thomas Kelly on 12/03/20 at  4:30 PM EDT by telephone and verified that I am speaking with the correct person using two identifiers.  Location: Patient: home Provider: office   I discussed the limitations, risks, security and privacy concerns of performing an evaluation and management service by telephone and the availability of in person appointments. I also discussed with the patient that there may be a patient responsible charge related to this service. The patient expressed understanding and agreed to proceed.   History of Present Illness: "I am doing well all things considered". He is staring summer vacation in 2 days. Thomas Kelly places to go for his son's graduation. He is also going to his other son's national guitar competition. Thomas Kelly is going to [place] for a month. At the end of the summer his son's are starting college at [employer] in [place]. He is on sabbatical in the fall. Thomas Kelly is moving out of his at the end of the summer. He will then go to [place] for a few weeks. He plans to live with [facility] for a little while until he finds a new place to live. He also plans to go to [place] city to study [employer]. It gives him something to do and gives a routine.  Thomas Kelly has a lot of things going on and it is tough. It is a little anxiety provoking. His depression is intermittent but very minimal. Thomas Kelly is sleeping well. He is not exercising as much as he should be. He is starting to get more active now that the summer if back. He denies SI/HI.    Observations/Objective:  General Appearance: unable to assess  Eye Contact:  unable to assess  Speech:  Clear and Coherent and Normal Rate  Volume:  Normal  Mood:  Euthymic  Affect:  Full Range  Thought Process:  Goal Directed, Linear and Descriptions of Associations: Intact  Orientation:  Full (Time, Place, and Person)  Thought Content:  Logical  Suicidal Thoughts:  No  Homicidal Thoughts:  No   Memory:  Immediate;   Good  Judgement:  Good  Insight:  Good  Psychomotor Activity: unable to assess  Concentration:  Concentration: Good  Recall:  Good  Fund of Knowledge:  Good  Language:  Good  Akathisia:  unable to assess  Handed:  Right  AIMS (if indicated):     Assets:  Communication Skills Desire for Improvement Financial Resources/Insurance Housing Intimacy Leisure Time Physical Health Resilience Social Support Talents/Skills Transportation Vocational/Educational  ADL's:  unable to assess  Cognition:  WNL  Sleep:         Assessment and Plan: Depression screen [facility] 2/9 12/03/2020  Decreased Interest 0  Down, Depressed, Hopeless 0  PHQ - 2 Score 0    Flowsheet Row Video Visit [facility] 12/03/2020 in [facility]  C-SSRS RISK CATEGORY No Risk     1. Major depressive disorder, single episode, mild (HCC) - buPROPion (WELLBUTRIN XL) 150 MG 24 hr tablet; Take 3 tablets (450 mg total) by mouth daily.  Dispense: 270 tablet; Refill: 1 - busPIRone (BUSPAR) 7.5 MG tablet; Take 1 tablet (7.5 mg total) by mouth daily.  Dispense: 90 tablet; Refill: 1    Follow Up Instructions: In 3 months or sooner if needed   I discussed the assessment and treatment plan with the patient. The patient was provided an opportunity to ask questions and all were answered. The patient agreed with the plan and  demonstrated an understanding of the instructions.   The patient was advised to call back or seek an in-person evaluation if the symptoms worsen or if the condition fails to improve as anticipated.  I provided 12 minutes of non-face-to-face time during this encounter.   Oletta Darter, MD

## 2020-12-16 DIAGNOSIS — M9903 Segmental and somatic dysfunction of lumbar region: Secondary | ICD-10-CM | POA: Diagnosis not present

## 2020-12-16 DIAGNOSIS — F411 Generalized anxiety disorder: Secondary | ICD-10-CM | POA: Diagnosis not present

## 2020-12-16 DIAGNOSIS — M5137 Other intervertebral disc degeneration, lumbosacral region: Secondary | ICD-10-CM | POA: Diagnosis not present

## 2020-12-16 DIAGNOSIS — M5386 Other specified dorsopathies, lumbar region: Secondary | ICD-10-CM | POA: Diagnosis not present

## 2020-12-16 DIAGNOSIS — M9905 Segmental and somatic dysfunction of pelvic region: Secondary | ICD-10-CM | POA: Diagnosis not present

## 2020-12-16 DIAGNOSIS — F3289 Other specified depressive episodes: Secondary | ICD-10-CM | POA: Diagnosis not present

## 2020-12-23 DIAGNOSIS — F411 Generalized anxiety disorder: Secondary | ICD-10-CM | POA: Diagnosis not present

## 2020-12-23 DIAGNOSIS — F3289 Other specified depressive episodes: Secondary | ICD-10-CM | POA: Diagnosis not present

## 2021-01-11 DIAGNOSIS — F411 Generalized anxiety disorder: Secondary | ICD-10-CM | POA: Diagnosis not present

## 2021-01-11 DIAGNOSIS — F3289 Other specified depressive episodes: Secondary | ICD-10-CM | POA: Diagnosis not present

## 2021-02-10 DIAGNOSIS — E7800 Pure hypercholesterolemia, unspecified: Secondary | ICD-10-CM | POA: Diagnosis not present

## 2021-02-10 DIAGNOSIS — Z125 Encounter for screening for malignant neoplasm of prostate: Secondary | ICD-10-CM | POA: Diagnosis not present

## 2021-02-10 DIAGNOSIS — Z0000 Encounter for general adult medical examination without abnormal findings: Secondary | ICD-10-CM | POA: Diagnosis not present

## 2021-02-12 DIAGNOSIS — Z23 Encounter for immunization: Secondary | ICD-10-CM | POA: Diagnosis not present

## 2021-02-12 DIAGNOSIS — Z0000 Encounter for general adult medical examination without abnormal findings: Secondary | ICD-10-CM | POA: Diagnosis not present

## 2021-02-12 DIAGNOSIS — Z20822 Contact with and (suspected) exposure to covid-19: Secondary | ICD-10-CM | POA: Diagnosis not present

## 2021-02-22 DIAGNOSIS — F411 Generalized anxiety disorder: Secondary | ICD-10-CM | POA: Diagnosis not present

## 2021-02-25 DIAGNOSIS — M5386 Other specified dorsopathies, lumbar region: Secondary | ICD-10-CM | POA: Diagnosis not present

## 2021-02-25 DIAGNOSIS — M9905 Segmental and somatic dysfunction of pelvic region: Secondary | ICD-10-CM | POA: Diagnosis not present

## 2021-02-25 DIAGNOSIS — M5137 Other intervertebral disc degeneration, lumbosacral region: Secondary | ICD-10-CM | POA: Diagnosis not present

## 2021-02-25 DIAGNOSIS — M9903 Segmental and somatic dysfunction of lumbar region: Secondary | ICD-10-CM | POA: Diagnosis not present

## 2021-03-11 DIAGNOSIS — F411 Generalized anxiety disorder: Secondary | ICD-10-CM | POA: Diagnosis not present

## 2021-03-11 DIAGNOSIS — M5386 Other specified dorsopathies, lumbar region: Secondary | ICD-10-CM | POA: Diagnosis not present

## 2021-03-11 DIAGNOSIS — M9903 Segmental and somatic dysfunction of lumbar region: Secondary | ICD-10-CM | POA: Diagnosis not present

## 2021-03-11 DIAGNOSIS — M5137 Other intervertebral disc degeneration, lumbosacral region: Secondary | ICD-10-CM | POA: Diagnosis not present

## 2021-03-11 DIAGNOSIS — M9905 Segmental and somatic dysfunction of pelvic region: Secondary | ICD-10-CM | POA: Diagnosis not present

## 2021-04-15 ENCOUNTER — Telehealth (HOSPITAL_BASED_OUTPATIENT_CLINIC_OR_DEPARTMENT_OTHER): Payer: BC Managed Care – PPO | Admitting: Psychiatry

## 2021-04-15 ENCOUNTER — Other Ambulatory Visit: Payer: Self-pay

## 2021-04-15 DIAGNOSIS — F320 Major depressive disorder, single episode, mild: Secondary | ICD-10-CM

## 2021-04-15 MED ORDER — BUSPIRONE HCL 7.5 MG PO TABS: 7.5000 mg | ORAL_TABLET | Freq: Every day | ORAL | 0 refills | Status: DC

## 2021-04-15 MED ORDER — BUPROPION HCL ER (XL) 150 MG PO TB24: 450.0000 mg | ORAL_TABLET | Freq: Every day | ORAL | 0 refills | Status: DC

## 2021-04-15 NOTE — Progress Notes (Signed)
Virtual Visit via Telephone Note  I connected with Thomas Kelly on 04/15/21 at  4:30 PM EDT by telephone and verified that I am speaking with the correct person using two identifiers.  Location: Patient: work Provider: office   I discussed the limitations, risks, security and privacy concerns of performing an evaluation and management service by telephone and the availability of in person appointments. I also discussed with the patient that there may be a patient responsible charge related to this service. The patient expressed understanding and agreed to proceed.   History of Present Illness: Thomas Kelly traveled a lot of the summer. Thomas Kelly has since sold Thomas Kelly house and moved. Thomas Kelly is now in [place] until late November and then will move back home. Thomas Kelly is busy and it is very intense but overall Thomas Kelly likes it. Thomas Kelly is a little concerned about being able to get the Wellbutrin and Buspar. Thomas Kelly weaned himself off of Wellbutrin from 450mg  to 150mg  over the last 2 weeks. Thomas Kelly has had off of it for 1 week. It was due to lack of supply and not intentional to stop it. Thomas Kelly has a lot of things going on. Thomas Kelly is now engaged. Thomas Kelly is happy but lonely since Thomas Kelly doesn't know anyone in yet. Thomas Kelly schedule is off due to the move. Thomas Kelly is coming next week. Overall Thomas Kelly mood is stable and Thomas Kelly is not depressed or anxious. Thomas Kelly is eating and sleeping well. Thomas Kelly denies SI/HI.     Observations/Objective:  General Appearance: unable to assess  Eye Contact:  unable to assess  Speech:  Clear and Coherent and Normal Rate  Volume:  Normal  Mood:  Euthymic  Affect:  Full Range  Thought Process:  Goal Directed, Linear, and Descriptions of Associations: Intact  Orientation:  Full (Time, Place, and Person)  Thought Content:  Logical  Suicidal Thoughts:  No  Homicidal Thoughts:  No  Memory:  Immediate;   Good  Judgement:  Good  Insight:  Good  Psychomotor Activity: unable to assess  Concentration:  Concentration: Good  Recall:   Good  Fund of Knowledge:  Good  Language:  Good  Akathisia:  unable to assess  Handed:  unable to assess  AIMS (if indicated):     Assets:  Communication Skills Desire for Improvement Financial Resources/Insurance Housing Intimacy Leisure Time Physical Health Resilience Social Support Talents/Skills Transportation Vocational/Educational  ADL's:  unable to assess  Cognition:  WNL  Sleep:        Assessment and Plan: - restart Wellbutrin XL 150mg  po qD x 7 days then increase 300mg  for 14 days then increase 450mg  qD  1. Major depressive disorder, single episode, mild (HCC) - buPROPion (WELLBUTRIN XL) 150 MG 24 hr tablet; Take 3 tablets (450 mg total) by mouth daily.  Dispense: 270 tablet; Refill: 0 - busPIRone (BUSPAR) 7.5 MG tablet; Take 1 tablet (7.5 mg total) by mouth daily.  Dispense: 90 tablet; Refill: 0   Follow Up Instructions: In 1-2 months or sooner if needed   I discussed the assessment and treatment plan with the patient. The patient was provided an opportunity to ask questions and all were answered. The patient agreed with the plan and demonstrated an understanding of the instructions.   The patient was advised to call back or seek an in-person evaluation if the symptoms worsen or if the condition fails to improve as anticipated.  I provided 19 minutes of non-face-to-face time during this encounter.   , MD

## 2021-06-24 ENCOUNTER — Other Ambulatory Visit: Payer: Self-pay

## 2021-06-24 ENCOUNTER — Telehealth (HOSPITAL_BASED_OUTPATIENT_CLINIC_OR_DEPARTMENT_OTHER): Payer: BC Managed Care – PPO | Admitting: Psychiatry

## 2021-06-24 DIAGNOSIS — F320 Major depressive disorder, single episode, mild: Secondary | ICD-10-CM | POA: Diagnosis not present

## 2021-06-24 MED ORDER — BUSPIRONE HCL 7.5 MG PO TABS: 7.5000 mg | ORAL_TABLET | Freq: Every day | ORAL | 0 refills | Status: DC

## 2021-06-24 MED ORDER — BUPROPION HCL ER (XL) 300 MG PO TB24: 300.0000 mg | ORAL_TABLET | Freq: Every day | ORAL | 0 refills | Status: DC

## 2021-06-24 NOTE — Progress Notes (Signed)
Virtual Visit via Video Note  I connected with Thomas Kelly on 06/24/21 at 10:30 AM EST by a video enabled telemedicine application and verified that I am speaking with the correct person using two identifiers.  Location: Patient: office at work Provider: office   I discussed the limitations of evaluation and management by telemedicine and the availability of in person appointments. The patient expressed understanding and agreed to proceed.  History of Present Illness: Thomas Kelly is back from his semester in [place]. He states it was a good experience. He had trouble getting his Buspar and was not able to take it since mid November. He restarted when he got back to [place] and has been on it for 3 days. Today his stomach is a little upset but overall he is denying other SE. He was off of it for almost 4 weeks. Thomas Kelly shares that at times he was overwhelmed by the sensory experiences. He denies any increase in anxiety off the Buspar. Thomas Kelly states he was lonely because he didn't really have any friends or contacts in the area. The depression was stable the whole time. Now that he is back "things are good". He is adjusting to many changes in his life here but he is working thru it. Thomas Kelly is living with his fiance right now but is looking for his own housing. Sleep, appetite and energy are good. Work is starting again on Jan 9th.  He denies SI/HI. Thomas Kelly has been taking Wellbutrin 300mg  since October and wants to continue at this time.    Observations/Objective: Psychiatric Specialty Exam: ROS  There were no vitals taken for this visit.There is no height or weight on file to calculate BMI.  General Appearance: Fairly Groomed and Neat  Eye Contact:  Good  Speech:  Clear and Coherent and Normal Rate  Volume:  Normal  Mood:  Euthymic  Affect:  Full Range  Thought Process:  Goal Directed, Linear, and Descriptions of Associations: Intact  Orientation:  Full (Time, Place, and Person)  Thought Content:  Logical   Suicidal Thoughts:  No  Homicidal Thoughts:  No  Memory:  Immediate;   Good  Judgement:  Good  Insight:  Good  Psychomotor Activity:  Normal  Concentration:  Concentration: Good  Recall:  Good  Fund of Knowledge:  Good  Language:  Good  Akathisia:  No  Handed:  Right  AIMS (if indicated):     Assets:  Communication Skills Desire for Improvement Financial Resources/Insurance Housing Intimacy Leisure Time Physical Health Resilience Social Support Talents/Skills Transportation Vocational/Educational  ADL's:  Intact  Cognition:  WNL  Sleep:        Assessment and Plan: Depression screen [facility] 2/9 06/24/2021 04/15/2021 12/03/2020  Decreased Interest 0 0 0  Down, Depressed, Hopeless 1 0 0  PHQ - 2 Score 1 0 0    Flowsheet Row Video Visit from 06/24/2021 in [facility] Video Visit from 04/15/2021 in [facility] Video Visit from 12[facility]03/2020 in [facility]  C-SSRS RISK CATEGORY No Risk No Risk No Risk       - decrease Wellbutrin XL to 300mg  po qD as Thomas Kelly has been taking this dose since October and his mood has stayed stable.  - restart Buspar   1. Major depressive disorder, single episode, mild (HCC) - buPROPion (WELLBUTRIN XL) 300 MG 24 hr tablet; Take 1 tablet (300 mg total) by mouth daily.  Dispense: 90 tablet; Refill: 0 - busPIRone (BUSPAR) 7.5 MG  tablet; Take 1 tablet (7.5 mg total) by mouth daily.  Dispense: 90 tablet; Refill: 0   Follow Up Instructions: In 3-4 months or sooner if needed   I discussed the assessment and treatment plan with the patient. The patient was provided an opportunity to ask questions and all were answered. The patient agreed with the plan and demonstrated an understanding of the instructions.   The patient was advised to call back or seek an in-person evaluation if the symptoms worsen or if the condition fails to improve as  anticipated.  I provided 14 minutes of non-face-to-face time during this encounter.   Oletta Darter, MD

## 2021-06-29 DIAGNOSIS — F411 Generalized anxiety disorder: Secondary | ICD-10-CM | POA: Diagnosis not present

## 2021-06-29 DIAGNOSIS — Z23 Encounter for immunization: Secondary | ICD-10-CM | POA: Diagnosis not present

## 2021-08-18 DIAGNOSIS — F411 Generalized anxiety disorder: Secondary | ICD-10-CM | POA: Diagnosis not present

## 2021-08-27 DIAGNOSIS — M9905 Segmental and somatic dysfunction of pelvic region: Secondary | ICD-10-CM | POA: Diagnosis not present

## 2021-08-27 DIAGNOSIS — M9903 Segmental and somatic dysfunction of lumbar region: Secondary | ICD-10-CM | POA: Diagnosis not present

## 2021-08-27 DIAGNOSIS — M5386 Other specified dorsopathies, lumbar region: Secondary | ICD-10-CM | POA: Diagnosis not present

## 2021-08-27 DIAGNOSIS — M5137 Other intervertebral disc degeneration, lumbosacral region: Secondary | ICD-10-CM | POA: Diagnosis not present

## 2021-09-10 DIAGNOSIS — M9905 Segmental and somatic dysfunction of pelvic region: Secondary | ICD-10-CM | POA: Diagnosis not present

## 2021-09-10 DIAGNOSIS — M9903 Segmental and somatic dysfunction of lumbar region: Secondary | ICD-10-CM | POA: Diagnosis not present

## 2021-09-10 DIAGNOSIS — M5386 Other specified dorsopathies, lumbar region: Secondary | ICD-10-CM | POA: Diagnosis not present

## 2021-09-10 DIAGNOSIS — M5137 Other intervertebral disc degeneration, lumbosacral region: Secondary | ICD-10-CM | POA: Diagnosis not present

## 2021-09-17 DIAGNOSIS — M5137 Other intervertebral disc degeneration, lumbosacral region: Secondary | ICD-10-CM | POA: Diagnosis not present

## 2021-09-17 DIAGNOSIS — M9905 Segmental and somatic dysfunction of pelvic region: Secondary | ICD-10-CM | POA: Diagnosis not present

## 2021-09-17 DIAGNOSIS — M5386 Other specified dorsopathies, lumbar region: Secondary | ICD-10-CM | POA: Diagnosis not present

## 2021-09-17 DIAGNOSIS — M9903 Segmental and somatic dysfunction of lumbar region: Secondary | ICD-10-CM | POA: Diagnosis not present

## 2021-09-20 DIAGNOSIS — H903 Sensorineural hearing loss, bilateral: Secondary | ICD-10-CM | POA: Diagnosis not present

## 2021-09-22 DIAGNOSIS — Z1211 Encounter for screening for malignant neoplasm of colon: Secondary | ICD-10-CM | POA: Diagnosis not present

## 2021-10-01 DIAGNOSIS — L570 Actinic keratosis: Secondary | ICD-10-CM | POA: Diagnosis not present

## 2021-10-01 DIAGNOSIS — M9905 Segmental and somatic dysfunction of pelvic region: Secondary | ICD-10-CM | POA: Diagnosis not present

## 2021-10-01 DIAGNOSIS — M5137 Other intervertebral disc degeneration, lumbosacral region: Secondary | ICD-10-CM | POA: Diagnosis not present

## 2021-10-01 DIAGNOSIS — L578 Other skin changes due to chronic exposure to nonionizing radiation: Secondary | ICD-10-CM | POA: Diagnosis not present

## 2021-10-01 DIAGNOSIS — M5386 Other specified dorsopathies, lumbar region: Secondary | ICD-10-CM | POA: Diagnosis not present

## 2021-10-01 DIAGNOSIS — X32XXXD Exposure to sunlight, subsequent encounter: Secondary | ICD-10-CM | POA: Diagnosis not present

## 2021-10-01 DIAGNOSIS — M9903 Segmental and somatic dysfunction of lumbar region: Secondary | ICD-10-CM | POA: Diagnosis not present

## 2021-10-15 DIAGNOSIS — M9903 Segmental and somatic dysfunction of lumbar region: Secondary | ICD-10-CM | POA: Diagnosis not present

## 2021-10-15 DIAGNOSIS — M9905 Segmental and somatic dysfunction of pelvic region: Secondary | ICD-10-CM | POA: Diagnosis not present

## 2021-10-15 DIAGNOSIS — M5386 Other specified dorsopathies, lumbar region: Secondary | ICD-10-CM | POA: Diagnosis not present

## 2021-10-15 DIAGNOSIS — M5137 Other intervertebral disc degeneration, lumbosacral region: Secondary | ICD-10-CM | POA: Diagnosis not present

## 2021-10-21 ENCOUNTER — Telehealth (HOSPITAL_BASED_OUTPATIENT_CLINIC_OR_DEPARTMENT_OTHER): Payer: BC Managed Care – PPO | Admitting: Psychiatry

## 2021-10-21 DIAGNOSIS — F320 Major depressive disorder, single episode, mild: Secondary | ICD-10-CM

## 2021-10-21 MED ORDER — BUPROPION HCL ER (XL) 300 MG PO TB24: 300.0000 mg | ORAL_TABLET | Freq: Every day | ORAL | 1 refills | Status: DC

## 2021-10-21 MED ORDER — BUSPIRONE HCL 7.5 MG PO TABS: 7.5000 mg | ORAL_TABLET | Freq: Every day | ORAL | 1 refills | Status: DC

## 2021-10-21 NOTE — Progress Notes (Signed)
Virtual Visit via Video Note ? ?I connected with Thomas Kelly on 10/21/21 at  9:00 AM EDT by a video enabled telemedicine application and verified that I am speaking with the correct person using two identifiers. ? ?Location: ?Patient: office at work ?Provider: office ?  ?I discussed the limitations of evaluation and management by telemedicine and the availability of in person appointments. The patient expressed understanding and agreed to proceed. ? ?History of Present Illness: ?"Things have been going fine". He is now living with his girlfriend and really likes it. They are doing some remodeling and adding an extra room for his son. He is no longer feeling stress about buying a house. Work is going well and he has set up boundaries to not take on extra work. He going to [place] this summer for a fellowship. Carsten still finds himself being anxious and he doesn't always notice it. He stopped therapy as they bought thought he had gained what he could from his therapist. He is eating and sleeping well. [place] continues to exercise. He had a day last week where he felt depressed. He talked to his girlfriend and felt better. He denies SI/HI.  ?  ?Observations/Objective: ?Psychiatric Specialty Exam: ?ROS  ?There were no vitals taken for this visit.There is no height or weight on file to calculate BMI.  ?General Appearance: Neat and Well Groomed  ?Eye Contact:  Good  ?Speech:  Clear and Coherent and Normal Rate  ?Volume:  Normal  ?Mood:  Euthymic  ?Affect:  Full Range  ?Thought Process:  Goal Directed, Linear, and Descriptions of Associations: Intact  ?Orientation:  Full (Time, Place, and Person)  ?Thought Content:  Logical  ?Suicidal Thoughts:  No  ?Homicidal Thoughts:  No  ?Memory:  Immediate;   Good  ?Judgement:  Good  ?Insight:  Good  ?Psychomotor Activity:  Normal  ?Concentration:  Concentration: Good  ?Recall:  Good  ?Fund of Knowledge:  Good  ?Language:  Good  ?Akathisia:  No  ?Handed:  Right  ?AIMS (if indicated):      ?Assets:  Communication Skills ?Desire for Improvement ?Financial Resources/Insurance ?Housing ?Intimacy ?Leisure Time ?Physical Health ?Resilience ?Social Support ?Talents/Skills ?Transportation ?Vocational/Educational  ?ADL's:  Intact  ?Cognition:  WNL  ?Sleep:     ? ? ? ?Assessment and Plan: ? ? ?  10/21/2021  ?  9:09 AM 06/24/2021  ? 10:50 AM 04/15/2021  ?  4:40 PM 12/03/2020  ?  4:42 PM  ?Depression screen PHQ 2/9  ?Decreased Interest 0 0 0 0  ?Down, Depressed, Hopeless 1 1 0 0  ?PHQ - 2 Score 1 1 0 0  ? ? ?Flowsheet Row [facility] Visit [facility] 10/21/2021 in [facility] Video Visit from 06/24/2021 in [facility] Video Visit from 04/15/2021 in [facility]  ?C-SSRS RISK CATEGORY No Risk No Risk No Risk  ? ?  ? ? ? ?Status of current problems: stable ? ?Meds:  ?1. Major depressive disorder, single episode, mild (HCC) ?- buPROPion (WELLBUTRIN XL) 300 MG 24 hr tablet; Take 1 tablet (300 mg total) by mouth daily.  Dispense: 90 tablet; Refill: 1 ?- busPIRone (BUSPAR) 7.5 MG tablet; Take 1 tablet (7.5 mg total) by mouth daily.  Dispense: 90 tablet; Refill: 1 ? ? ? ?Therapy: brief supportive therapy provided.  ? ? ?Collaboration of Care: Other none today ? ?Patient/Guardian was advised Release of Information must be obtained prior to any record release in order to collaborate their care  with an outside provider. Patient/Guardian was advised if they have not already done so to contact the registration department to sign all necessary forms in order for [place] to release information regarding their care.  ? ?Consent: Patient/Guardian gives verbal consent for treatment and assignment of benefits for services provided during this visit. Patient/Guardian expressed understanding and agreed to proceed.  ? ? ? ? ?Follow Up Instructions: ?Follow up in 5-6 months or sooner if needed ?  ? ?I discussed the assessment and treatment  plan with the patient. The patient was provided an opportunity to ask questions and all were answered. The patient agreed with the plan and demonstrated an understanding of the instructions. ?  ?The patient was advised to call back or seek an in-person evaluation if the symptoms worsen or if the condition fails to improve as anticipated. ? ?I provided 9 minutes of non-face-to-face time during this encounter. ? ? ?Oletta Darter, MD ? ?

## 2021-11-05 DIAGNOSIS — M5386 Other specified dorsopathies, lumbar region: Secondary | ICD-10-CM | POA: Diagnosis not present

## 2021-11-05 DIAGNOSIS — M9903 Segmental and somatic dysfunction of lumbar region: Secondary | ICD-10-CM | POA: Diagnosis not present

## 2021-11-05 DIAGNOSIS — M5137 Other intervertebral disc degeneration, lumbosacral region: Secondary | ICD-10-CM | POA: Diagnosis not present

## 2021-11-05 DIAGNOSIS — M9905 Segmental and somatic dysfunction of pelvic region: Secondary | ICD-10-CM | POA: Diagnosis not present

## 2021-11-19 DIAGNOSIS — M5386 Other specified dorsopathies, lumbar region: Secondary | ICD-10-CM | POA: Diagnosis not present

## 2021-11-19 DIAGNOSIS — M9903 Segmental and somatic dysfunction of lumbar region: Secondary | ICD-10-CM | POA: Diagnosis not present

## 2021-11-19 DIAGNOSIS — M5137 Other intervertebral disc degeneration, lumbosacral region: Secondary | ICD-10-CM | POA: Diagnosis not present

## 2021-11-19 DIAGNOSIS — M9905 Segmental and somatic dysfunction of pelvic region: Secondary | ICD-10-CM | POA: Diagnosis not present

## 2021-12-03 DIAGNOSIS — M9905 Segmental and somatic dysfunction of pelvic region: Secondary | ICD-10-CM | POA: Diagnosis not present

## 2021-12-03 DIAGNOSIS — M5386 Other specified dorsopathies, lumbar region: Secondary | ICD-10-CM | POA: Diagnosis not present

## 2021-12-03 DIAGNOSIS — M9903 Segmental and somatic dysfunction of lumbar region: Secondary | ICD-10-CM | POA: Diagnosis not present

## 2021-12-03 DIAGNOSIS — M5137 Other intervertebral disc degeneration, lumbosacral region: Secondary | ICD-10-CM | POA: Diagnosis not present

## 2021-12-17 DIAGNOSIS — M9903 Segmental and somatic dysfunction of lumbar region: Secondary | ICD-10-CM | POA: Diagnosis not present

## 2021-12-17 DIAGNOSIS — M9905 Segmental and somatic dysfunction of pelvic region: Secondary | ICD-10-CM | POA: Diagnosis not present

## 2021-12-17 DIAGNOSIS — M5386 Other specified dorsopathies, lumbar region: Secondary | ICD-10-CM | POA: Diagnosis not present

## 2021-12-17 DIAGNOSIS — M5137 Other intervertebral disc degeneration, lumbosacral region: Secondary | ICD-10-CM | POA: Diagnosis not present

## 2021-12-29 DIAGNOSIS — M545 Low back pain, unspecified: Secondary | ICD-10-CM | POA: Diagnosis not present

## 2021-12-31 DIAGNOSIS — M9905 Segmental and somatic dysfunction of pelvic region: Secondary | ICD-10-CM | POA: Diagnosis not present

## 2021-12-31 DIAGNOSIS — M9903 Segmental and somatic dysfunction of lumbar region: Secondary | ICD-10-CM | POA: Diagnosis not present

## 2021-12-31 DIAGNOSIS — M5137 Other intervertebral disc degeneration, lumbosacral region: Secondary | ICD-10-CM | POA: Diagnosis not present

## 2021-12-31 DIAGNOSIS — M5386 Other specified dorsopathies, lumbar region: Secondary | ICD-10-CM | POA: Diagnosis not present

## 2022-01-03 DIAGNOSIS — M545 Low back pain, unspecified: Secondary | ICD-10-CM | POA: Diagnosis not present

## 2022-01-06 DIAGNOSIS — M545 Low back pain, unspecified: Secondary | ICD-10-CM | POA: Diagnosis not present

## 2022-01-17 DIAGNOSIS — M545 Low back pain, unspecified: Secondary | ICD-10-CM | POA: Diagnosis not present

## 2022-01-19 DIAGNOSIS — M5137 Other intervertebral disc degeneration, lumbosacral region: Secondary | ICD-10-CM | POA: Diagnosis not present

## 2022-01-19 DIAGNOSIS — M5386 Other specified dorsopathies, lumbar region: Secondary | ICD-10-CM | POA: Diagnosis not present

## 2022-01-19 DIAGNOSIS — M9905 Segmental and somatic dysfunction of pelvic region: Secondary | ICD-10-CM | POA: Diagnosis not present

## 2022-01-19 DIAGNOSIS — M9903 Segmental and somatic dysfunction of lumbar region: Secondary | ICD-10-CM | POA: Diagnosis not present

## 2022-01-24 DIAGNOSIS — M545 Low back pain, unspecified: Secondary | ICD-10-CM | POA: Diagnosis not present

## 2022-03-08 DIAGNOSIS — H40013 Open angle with borderline findings, low risk, bilateral: Secondary | ICD-10-CM | POA: Diagnosis not present

## 2022-03-24 ENCOUNTER — Telehealth (HOSPITAL_BASED_OUTPATIENT_CLINIC_OR_DEPARTMENT_OTHER): Payer: BC Managed Care – PPO | Admitting: Psychiatry

## 2022-03-24 ENCOUNTER — Encounter (HOSPITAL_COMMUNITY): Payer: Self-pay | Admitting: Psychiatry

## 2022-03-24 DIAGNOSIS — F320 Major depressive disorder, single episode, mild: Secondary | ICD-10-CM | POA: Diagnosis not present

## 2022-03-24 MED ORDER — BUSPIRONE HCL 7.5 MG PO TABS: 7.5000 mg | ORAL_TABLET | Freq: Every day | ORAL | 1 refills | Status: DC

## 2022-03-24 MED ORDER — BUPROPION HCL ER (XL) 300 MG PO TB24: 300.0000 mg | ORAL_TABLET | Freq: Every day | ORAL | 1 refills | Status: DC

## 2022-03-24 NOTE — Progress Notes (Signed)
Virtual Visit via Video Note  I connected with Thomas Kelly on 03/24/22 at  9:00 AM EDT by   a video enabled telemedicine application and verified that I am speaking with the correct person using two identifiers.  Location: Patient: home Provider: office   I discussed the limitations of evaluation and management by telemedicine and the availability of in person appointments. The patient expressed understanding and agreed to proceed.  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 He is forced to be more efficient and organized which is a good thing. His anxiety has been mild and situational appropriate. He is now engaged and is happy.  Thomas Kelly feels less depressed now that he has in a long time. He feels more secure in his relationship and his living situation. He denies depression in the last few months. Thomas Kelly denies anhedonia and isolation. He is sleeping well. He denies SI/HI.   Observations/Objective: Psychiatric Specialty Exam: ROS  There were no vitals taken for this visit.There is no height or weight on file to calculate BMI.  General Appearance: Casual and Neat  Eye Contact:  Good  Speech:  Clear and Coherent and Normal Rate  Volume:  Normal  Mood:  Euthymic  Affect:  Full Range  Thought Process:  Goal Directed, Linear, and Descriptions of Associations: Intact  Orientation:  Full (Time, Place, and Person)  Thought Content:  Logical  Suicidal Thoughts:  No  Homicidal Thoughts:  No  Memory:  Immediate;   Good  Judgement:  Good  Insight:  Good  Psychomotor Activity:  Normal  Concentration:  Concentration: Good  Recall:  Good  Fund of Knowledge:  Good  Language:  Good  Akathisia:  No  Handed:  Right  AIMS (if indicated):     Assets:  Communication Skills Desire for Improvement Financial Resources/Insurance Housing Intimacy Leisure Time Physical Health Resilience Social  Support Talents/Skills Transportation Vocational/Educational  ADL's:  Intact  Cognition:  WNL  Sleep:        Assessment and Plan:     03/24/2022    9:12 AM 10/21/2021    9:09 AM 06/24/2021   10:50 AM 04/15/2021    4:40 PM 12/03/2020    4:42 PM  Depression screen PHQ 2/9  Decreased Interest 0 0 0 0 0  Down, Depressed, Hopeless 0 1 1 0 0  PHQ - 2 Score 0 1 1 0 0    Flowsheet Row Video Visit [facility] 03/24/2022 in [facility] Video Visit from 10/21/2021 in [facility] Video Visit from 06/24/2021 in [facility]  C-SSRS RISK CATEGORY No Risk No Risk No Risk        Status of current problems: stable  Meds:  1. Major depressive disorder, single episode, mild (HCC) - buPROPion (WELLBUTRIN XL) 300 MG 24 hr tablet; Take 1 tablet (300 mg total) by mouth daily.  Dispense: 90 tablet; Refill: 1 - busPIRone (BUSPAR) 7.5 MG tablet; Take 1 tablet (7.5 mg total) by mouth daily.  Dispense: 90 tablet; Refill: 1     Labs: none    Therapy: brief supportive therapy provided.    Collaboration of Care: Other none  Patient/Guardian was advised Release of Information must be obtained prior to any record release in order to collaborate their care with an outside provider. Patient/Guardian was advised if they have not already done so to contact the registration department to  sign all necessary forms in order for [place] to release information regarding their care.   Consent: Patient/Guardian gives verbal consent for treatment and assignment of benefits for services provided during this visit. Patient/Guardian expressed understanding and agreed to proceed.     Follow Up Instructions: Follow up in 5-6 months or sooner if needed    I discussed the assessment and treatment plan with the patient. The patient was provided an opportunity to ask questions and all were answered. The patient agreed  with the plan and demonstrated an understanding of the instructions.   The patient was advised to call back or seek an in-person evaluation if the symptoms worsen or if the condition fails to improve as anticipated.  I provided 9 minutes of non-face-to-face time during this encounter.   Oletta Darter, MD

## 2022-09-15 ENCOUNTER — Telehealth (HOSPITAL_BASED_OUTPATIENT_CLINIC_OR_DEPARTMENT_OTHER): Payer: BC Managed Care – PPO | Admitting: Psychiatry

## 2022-09-15 DIAGNOSIS — F320 Major depressive disorder, single episode, mild: Secondary | ICD-10-CM

## 2022-09-15 MED ORDER — BUPROPION HCL ER (XL) 300 MG PO TB24: 300.0000 mg | ORAL_TABLET | Freq: Every day | ORAL | 1 refills | Status: DC

## 2022-09-15 MED ORDER — BUSPIRONE HCL 7.5 MG PO TABS: 7.5000 mg | ORAL_TABLET | Freq: Every day | ORAL | 1 refills | Status: DC

## 2022-09-15 NOTE — Progress Notes (Signed)
Virtual Visit via Video Note  I connected with Thomas Kelly on 09/15/22 at  9:00 AM EST by  a video enabled telemedicine application and verified that I am speaking with the correct person using two identifiers.  Location: Patient: Home Provider: office   I discussed the limitations of evaluation and management by telemedicine and the availability of in person appointments. The patient expressed understanding and agreed to proceed.  History of Present Illness: Thomas Kelly shares he is doing well. He has next week off for spring break. He is anxious and depressed but that is normal time of the year. Thomas Kelly is not concerned about it because it is not overwhelming. He is sleeping and eating well. He is exercising more. He got a temporary job offer in [place] for 1 year. He will leaving in August and be back for holidays and the position ends in June. Thomas Kelly is getting married in November. He denies SI/HI.    Observations/Objective: Psychiatric Specialty Exam: ROS  There were no vitals taken for this visit.There is no height or weight on file to calculate BMI.  General Appearance: Fairly Groomed and Neat  Eye Contact:  Good  Speech:  Clear and Coherent and Normal Rate  Volume:  Normal  Mood:  Anxious and Depressed  Affect:  Full Range  Thought Process:  Goal Directed, Linear, and Descriptions of Associations: Intact  Orientation:  Full (Time, Place, and Person)  Thought Content:  Logical  Suicidal Thoughts:  No  Homicidal Thoughts:  No  Memory:  Immediate;   Good  Judgement:  Good  Insight:  Good  Psychomotor Activity:  Normal  Concentration:  Concentration: Good  Recall:  Good  Fund of Knowledge:  Good  Language:  Good  Akathisia:  No  Handed:  Right  AIMS (if indicated):     Assets:  Communication Skills Desire for Improvement Financial Resources/Insurance Housing Intimacy Leisure Time Physical Health Resilience Social Support Talents/Skills Transportation Vocational/Educational   ADL's:  Intact  Cognition:  WNL  Sleep:        Assessment and Plan:     09/15/2022    9:19 AM 03/24/2022    9:12 AM 10/21/2021    9:09 AM 06/24/2021   10:50 AM 04/15/2021    4:40 PM  Depression screen PHQ 2/9  Decreased Interest 0 0 0 0 0  Down, Depressed, Hopeless 1 0 1 1 0  PHQ - 2 Score 1 0 1 1 0  Altered sleeping 0      Tired, decreased energy 0      Change in appetite 0      Feeling bad or failure about yourself  0      Trouble concentrating 0      Moving slowly or fidgety/restless 0      Suicidal thoughts 0      PHQ-9 Score 1      Difficult doing work/chores Somewhat difficult        Flowsheet Row Video Visit from 09/15/2022 in [facility] Video Visit from 03/24/2022 in [facility] Video Visit from 10/21/2021 in [facility] No Risk No Risk No Risk             Status of current problems: overall stable. Thomas Kelly is going to be working for 1 year in [place] from August 2024- June 2025. He will be back for holidays in November and December.    Medication management with supportive therapy.  Risks and benefits, side effects and alternative treatment options discussed with patient. Pt was given an opportunity to ask questions about medication, illness, and treatment. All current psychiatric medications have been reviewed and discussed with the patient and adjusted as clinically appropriate.  Pt verbalized understanding and verbal consent obtained for treatment.  Meds:  1. Major depressive disorder, single episode, mild (HCC) - buPROPion (WELLBUTRIN XL) 300 MG 24 hr tablet; Take 1 tablet (300 mg total) by mouth daily.  Dispense: 90 tablet; Refill: 1 - busPIRone (BUSPAR) 7.5 MG tablet; Take 1 tablet (7.5 mg total) by mouth daily.  Dispense: 90 tablet; Refill: 1     Labs: none    Therapy: brief supportive therapy provided. Discussed  psychosocial stressors in detail.     Collaboration of Care: Referral or follow-up with counselor/therapist [facility] continue therapy  Patient/Guardian was advised Release of Information must be obtained prior to any record release in order to collaborate their care with an outside provider. Patient/Guardian was advised if they have not already done so to contact the registration department to sign all necessary forms in order for [place] to release information regarding their care.   Consent: Patient/Guardian gives verbal consent for treatment and assignment of benefits for services provided during this visit. Patient/Guardian expressed understanding and agreed to proceed.     Follow Up Instructions: Follow up in 5-6 months or sooner if needed    I discussed the assessment and treatment plan with the patient. The patient was provided an opportunity to ask questions and all were answered. The patient agreed with the plan and demonstrated an understanding of the instructions.   The patient was advised to call back or seek an in-person evaluation if the symptoms worsen or if the condition fails to improve as anticipated.  I provided 12 minutes of non-face-to-face time during this encounter.   Charlcie Cradle, MD

## 2022-11-21 ENCOUNTER — Encounter (HOSPITAL_COMMUNITY): Payer: Self-pay

## 2023-02-02 ENCOUNTER — Telehealth (HOSPITAL_COMMUNITY): Payer: BC Managed Care – PPO | Admitting: Psychiatry

## 2023-02-05 DIAGNOSIS — L237 Allergic contact dermatitis due to plants, except food: Secondary | ICD-10-CM | POA: Diagnosis not present

## 2023-02-06 ENCOUNTER — Ambulatory Visit (HOSPITAL_COMMUNITY): Payer: BC Managed Care – PPO | Admitting: Student

## 2023-02-06 VITALS — BP 142/87 | HR 72 | Ht 70.0 in | Wt 219.0 lb

## 2023-02-06 DIAGNOSIS — F411 Generalized anxiety disorder: Secondary | ICD-10-CM | POA: Diagnosis not present

## 2023-02-06 DIAGNOSIS — F320 Major depressive disorder, single episode, mild: Secondary | ICD-10-CM

## 2023-02-06 MED ORDER — BUSPIRONE HCL 7.5 MG PO TABS: 7.5000 mg | ORAL_TABLET | Freq: Every day | ORAL | 1 refills | Status: DC

## 2023-02-06 MED ORDER — BUPROPION HCL ER (XL) 300 MG PO TB24: 300.0000 mg | ORAL_TABLET | Freq: Every day | ORAL | 1 refills | Status: DC

## 2023-02-06 NOTE — Progress Notes (Signed)
BH MD Outpatient Progress Note  02/06/2023 2:42 PM Thomas Kelly  MRN:  [id number]  Assessment:  Thomas Kelly presents for follow-up evaluation in-person. Today, 02/06/23, patient reports mild anxiety and no depression.  He is noted to have a linear and logical thought process with fair insight.  His affect is appropriate.  He requests medication refills today.  Unfortunately the patient will be leaving for 1 year and will need to establish care in [place] for the time being.  Identifying Information: Thomas Kelly is a 52 y.o. y.o. male with a history of major depressive disorder, single episode moderate who is an established patient with [facility] for management of depression and anxiety.   Plan:  # Major depressive disorder, single episode, moderate  generalized anxiety disorder Interventions: -- Continue Wellbutrin 300 mg XL daily - Continue buspirone 7.5 mg daily - Refill sent in for medication - Patient will establish care in [place] because he cannot come back to our clinic until March - Patient was told to reach out with questions or concerns, can reestablish care here later  Patient was given contact information for behavioral health clinic and was instructed to call 911 for emergencies.   Subjective:  Chief Complaint:  Chief Complaint  Patient presents with   Follow-up    Interval History:  Today the patient reports that he is doing well in the midst of life changes.  He states that he is getting married to his partner of 10 years in November.  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  He states that he is leaving in 3 weeks to take this position but before that is going to [place] for 1 week. He says that he will be unable to return to the clinic until March of next year.  We discussed that he will need to find a psychiatrist in the area and the patient expressed his understanding.  The  patient reports that his medication regimen has been stable for several years and that he feels it is effective in treating his depression and anxiety.  He says that he is experiencing no depression but does feel somewhat "listless" at times when he is inactive.  He states that he is sleeping well and has adequate energy.  He reports steady and appropriate appetite.  He denies experiencing any hopelessness or suicidal thoughts.  He denies ever attempting suicide or being admitted to a psychiatric hospital.  The patient does report that his anxiety is significant, but he does feel it is proportional to the life changes occurring.  He says that he spends extra time thinking through his to do list and that this causes him stress.  The patient reports strong family relationships with his partner and his children, several of whom are adults living out of the home.  Visit Diagnosis:    ICD-10-CM   1. Major depressive disorder, single episode, mild (HCC)  F32.0 buPROPion (WELLBUTRIN XL) 300 MG 24 hr tablet    busPIRone (BUSPAR) 7.5 MG tablet    2. Generalized anxiety disorder  F41.1       Past Psychiatric History: as above  Past Medical History:  Past Medical History:  Diagnosis Date   Anxiety    Depression    GERD (gastroesophageal reflux disease)     Past Surgical History:  Procedure Laterality Date   VASECTOMY  2014    Family Psychiatric History: none pertinent  Family History:  Family  History  Problem Relation Age of Onset   ADD / ADHD Son    Bipolar disorder Son    Alcohol abuse Neg Hx    Anxiety disorder Neg Hx    Dementia Neg Hx    Depression Neg Hx    Drug abuse Neg Hx    Schizophrenia Neg Hx    Suicidality Neg Hx     Social History:  Social History   Socioeconomic History   Marital status: Media planner    Spouse name: Not on file   Number of children: Not on file   Years of education: Not on file   Highest education level: Not on file  Occupational History    Not on file  Tobacco Use   Smoking status: Former    Current packs/day: 0.00    Types: Cigarettes    Quit date: 11/29/2007    Years since quitting: 15.2   Smokeless tobacco: Never  Vaping Use   Vaping status: Never Used  Substance and Sexual Activity   Alcohol use: Yes    Alcohol/week: 6.0 standard drinks of alcohol    Types: 6 Shots of liquor per week    Comment: a few times a week   Drug use: No   Sexual activity: Yes    Partners: Female    Birth control/protection: None  Other Topics Concern   Not on file  Social History Narrative   Not on file   Social Determinants of Health   Financial Resource Strain: Not on file  Food Insecurity: Not on file  Transportation Needs: Not on file  Physical Activity: Not on file  Stress: Not on file  Social Connections: Not on file    Allergies: No Known Allergies  Current Medications: Current Outpatient Medications  Medication Sig Dispense Refill   buPROPion (WELLBUTRIN XL) 300 MG 24 hr tablet Take 1 tablet (300 mg total) by mouth daily. 60 tablet 1   busPIRone (BUSPAR) 7.5 MG tablet Take 1 tablet (7.5 mg total) by mouth daily. 60 tablet 1   Multiple Vitamin (MULTIVITAMIN) capsule Take 1 capsule by mouth daily.     Omeprazole 20 MG TBEC Take 20-40 mg by mouth daily.     No current facility-administered medications for this visit.     Objective:  Psychiatric Specialty Exam: Physical Exam Constitutional:      Appearance: the patient is not toxic-appearing.  Pulmonary:     Effort: Pulmonary effort is normal.  Neurological:     General: No focal deficit present.     Mental Status: the patient is alert and oriented to person, place, and time.   Review of Systems  Respiratory:  Negative for shortness of breath.   Cardiovascular:  Negative for chest pain.  Gastrointestinal:  Negative for abdominal pain, constipation, diarrhea, nausea and vomiting.  Neurological:  Negative for headaches.      BP (!) 142/87   Pulse 72    Ht 5\' 10"  (1.778 m)   Wt 219 lb (99.3 kg)   BMI 31.42 kg/m   General Appearance: Fairly Groomed  Eye Contact:  Good  Speech:  Clear and Coherent  Volume:  Normal  Mood:  Euthymic  Affect:  Congruent  Thought Process:  Coherent  Orientation:  Full (Time, Place, and Person)  Thought Content: Logical   Suicidal Thoughts:  No  Homicidal Thoughts:  No  Memory:  Immediate;   Good  Judgement:  fair  Insight:  fair  Psychomotor Activity:  Normal  Concentration:  Concentration:  Good  Recall:  Good  Fund of Knowledge: Good  Language: Good  Akathisia:  No  Handed:    AIMS (if indicated): not done  Assets:  Communication Skills Desire for Improvement Financial Resources/Insurance Housing Leisure Time Physical Health  ADL's:  Intact  Cognition: WNL  Sleep:  Fair     Metabolic Disorder Labs: No results found for: "HGBA1C", "MPG" No results found for: "PROLACTIN" No results found for: "CHOL", "TRIG", "HDL", "CHOLHDL", "VLDL", "LDLCALC" No results found for: "TSH"  Therapeutic Level Labs: No results found for: "LITHIUM" No results found for: "VALPROATE" No results found for: "CBMZ"  Screenings: PHQ2-9    Flowsheet Row Video Visit [facility] 09/15/2022 in [facility] Video Visit from 03/24/2022 in [facility] Video Visit from 10/21/2021 in [facility] Video Visit from 06[facility]24/2021 in [facility] Video Visit from 04/15/2021 in [facility]  PHQ-2 Total Score 1 0 1 1 0  PHQ-9 Total Score 1 -- -- -- --      Flowsheet Row Video Visit from 09/15/2022 in [facility] Video Visit from 03[facility]24/2022 in [facility] Video Visit from 10[facility]21/2021 in [facility]  C-SSRS RISK CATEGORY No Risk No Risk  No Risk       Collaboration of Care: none  A total of 30 minutes was spent involved in face to face clinical care, chart review, documentation.   Carlyn Reichert, MD 02/06/2023, 2:42 PM

## 2023-02-06 NOTE — Addendum Note (Signed)
Addended by: Everlena Cooper on: 02/06/2023 04:43 PM   Modules accepted: Level of Service

## 2024-02-07 NOTE — Progress Notes (Signed)
 BH MD Outpatient Progress Note  02/14/2024 3:14 PM Thomas Kelly  MRN:  [id number]  Assessment:  Thomas Kelly presents for follow-up evaluation in-person. Patient is doing fairly well. 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 He is readjusting back to living here and feels some sadness with leaving his ideal job but managing appropriately. He did get diagnosed with ADHD in [place]  in which he was started on Vyvanse initially and changed from Wellbutrin  to Prozac but did not feel Prozac was efficacious enough and was experiencing low libido. The Vyvanse he felt also made him feel more depressed so he was put back on Wellbutrin  and started on Adderall in which he feels this regimen is helping his depression and focus. We will restart his home Adderall and continue Wellbutrin . UDS today was negative. Will check BP closely and reassuringly, patient also keeps a record of his BP readings at home. Will f/u in one month.  Identifying Information: Thomas Kelly is a 53 y.o. y.o. male with a history of major depressive disorder, single episode moderate who is an established patient with [facility] for management of depression and anxiety.   Plan:  # Major depressive disorder, recurrent, moderate # Generalized anxiety disorder -- Continue Wellbutrin  300 mg XL daily - Continue buspirone  7.5 mg daily  # ADHD, inattentive type Past medication trials: Vyvanse (increased depression) -- Restart Adderall 15 mg daily  -- [facility] [place]  testing completed in February 2025, results in the media tab  -- Previously was taking 15 mg on weekdays since March  -- Rapid UDS negative -- Wellbutrin  as above   Patient was given contact information for behavioral health clinic and was instructed to call 911 for emergencies.   Subjective:  Chief Complaint:  Chief Complaint  Patient presents with   Follow-up   Depression   Anxiety     Interval History:  Previously lived in [place]  for work and returned to [place] early this year. Depression and anxiety were stable.  Patient seen alone.  Patient reports feeling good today. He has been working on a project that is due today and he feels tired but excited. He reports fluctuating stress with his job. He feels his prior job was great and now learning to adjust back to living in [place]. He reports still enjoying the work that he is currently doing. He reports the semester will start 8/20 and he feels that he will need the Adderall during that time.   Patient reports good sleep for the most part, this week has been more difficult because of the ongoing work project. Patient reports good appetite.  Patient rates anxiety a 6/10, depression a 6-7/10, and anger a 3/10.   Patient denies current SI, HI, and AVH. His motivating factors are his children and wife.   Substance use: denies illicit substance use, denies tobacco use, reports varying amounts of alcohol use- few drinks in the weekend  Stopped taking Buspar  mainly due to travel in 2022. In [place] , he was dxed with ADHD prescribed Adderall and took the medication on weekdays. He completed the [facility] [place]  scale on February 10th. He started Adderall in March. He most recently took the Adderall early June.  He does notice benefit in prioritization and focusing.   Visit Diagnosis:    ICD-10-CM   1. Major depressive disorder, single episode, moderate (HCC)  F32.1 buPROPion  (WELLBUTRIN  XL) 300 MG 24 hr tablet    2. Attention deficit  hyperactivity disorder (ADHD), predominantly inattentive type  F90.0 amphetamine -dextroamphetamine  (ADDERALL) 15 MG tablet    3. High risk medication use  Z79.899 Rapid urine drug screen (hospital performed)       Past Psychiatric History:  Dx: MDD, ADHD (dx last year) Medications: Wellbutrin  and Buspar , tried Vyvanse (short amount of time of trial- states it made him feel depressed), Prozac  (low libido) Previously saw Dr. Marry in 01/2023 Currently seeing a therapist  [name]  Past Medical History:  Past Medical History:  Diagnosis Date   Anxiety    Depression    GERD (gastroesophageal reflux disease)     Past Surgical History:  Procedure Laterality Date   VASECTOMY  2014    Family Psychiatric History: none pertinent  Family History:  Family History  Problem Relation Age of Onset   ADD / ADHD Son    Bipolar disorder Son    Alcohol abuse Neg Hx    Anxiety disorder Neg Hx    Dementia Neg Hx    Depression Neg Hx    Drug abuse Neg Hx    Schizophrenia Neg Hx    Suicidality Neg Hx     Social History:  Living: returned to [place] in June 2025, stayed in [place]  last year Occupation: professor Relationship: married, wife is a Paramedic Children: 5 children Support: wife, friends  Social History   Socioeconomic History   Marital status: Media planner    Spouse name: Not on file   Number of children: Not on file   Years of education: Not on file   Highest education level: Not on file  Occupational History   Not on file  Tobacco Use   Smoking status: Former    Current packs/day: 0.00    Types: Cigarettes    Quit date: 11/29/2007    Years since quitting: 16.2   Smokeless tobacco: Never  Vaping Use   Vaping status: Never Used  Substance and Sexual Activity   Alcohol use: Yes    Alcohol/week: 6.0 standard drinks of alcohol    Types: 6 Shots of liquor per week    Comment: a few times a week   Drug use: No   Sexual activity: Yes    Partners: Female    Birth control/protection: None  Other Topics Concern   Not on file  Social History Narrative   Not on file   Social Drivers of Health   Financial Resource Strain: Not on file  Food Insecurity: Not on file  Transportation Needs: Not on file  Physical Activity: Not on file  Stress: Not on file  Social Connections: Not on file    Allergies: No Known Allergies  Current  Medications: Current Outpatient Medications  Medication Sig Dispense Refill   amphetamine -dextroamphetamine  (ADDERALL) 15 MG tablet Take 1 tablet by mouth daily. 30 tablet 0   buPROPion  (WELLBUTRIN  XL) 300 MG 24 hr tablet Take 1 tablet (300 mg total) by mouth daily. 30 tablet 1   Multiple Vitamin (MULTIVITAMIN) capsule Take 1 capsule by mouth daily.     Omeprazole 20 MG TBEC Take 20-40 mg by mouth daily.     No current facility-administered medications for this visit.     Objective: Psychiatric Specialty Exam: General Appearance: appears at stated age, casually dressed and groomed   Behavior: pleasant and cooperative   Psychomotor Activity: no psychomotor agitation or retardation noted   Eye Contact: fair  Speech: normal amount, volume and fluency    Mood: euthymic  Affect: congruent, pleasant and  interactive   Thought Process: linear, goal directed, no circumstantial or tangential thought process noted, no racing thoughts or flight of ideas  Descriptions of Associations: intact   Thought Content Hallucinations: denies AH, VH , does not appear responding to stimuli  Delusions: no paranoia, delusions of control, grandeur, ideas of reference, thought broadcasting, and magical thinking  Suicidal Thoughts: denies SI, intention, plan  Homicidal Thoughts: denies HI, intention, plan   Alertness/Orientation: alert and fully oriented   Insight: fair Judgment: fair  Memory: intact   Executive Functions  Concentration: intact  Attention Span: fair  Recall: intact  Fund of Knowledge: fair   Physical Exam  General: Pleasant, well-appearing . No acute distress. Pulmonary: Normal effort. No wheezing or rales. Skin: No obvious rash or lesions. Neuro: A&Ox3.No focal deficit.  Review of Systems No reported symptoms    Metabolic Disorder Labs: No results found for: HGBA1C, MPG No results found for: PROLACTIN No results found for: CHOL, TRIG, HDL, CHOLHDL,  VLDL, LDLCALC No results found for: TSH  Therapeutic Level Labs: No results found for: LITHIUM No results found for: VALPROATE No results found for: CBMZ  Screenings: PHQ2-9    Flowsheet Row Video [facility] Video Visit from 03/24/2022 in [facility] Video Visit from 10[facility]21/2021 in [facility] Video Visit from 06/24/2021 in [facility] Video Visit from 04/15/2021 in [facility]  PHQ-2 Total Score 1 0 1 1 0  PHQ-9 Total Score 1 -- -- -- --   Flowsheet Row Video Visit from 09/15/2022 in [facility] Video Visit from 03/24/2022 in [facility] Video Visit from 10[facility]21/2021 in [facility]  C-SSRS RISK CATEGORY No Risk No Risk No Risk    Ismael Franco, MD PGY-3 Psychiatry Resident

## 2024-02-14 ENCOUNTER — Ambulatory Visit (HOSPITAL_COMMUNITY): Admitting: Psychiatry

## 2024-02-14 ENCOUNTER — Encounter (HOSPITAL_COMMUNITY): Payer: Self-pay | Admitting: Psychiatry

## 2024-02-14 ENCOUNTER — Other Ambulatory Visit (HOSPITAL_COMMUNITY): Payer: Self-pay

## 2024-02-14 VITALS — BP 134/88 | Ht 69.0 in | Wt 214.0 lb

## 2024-02-14 DIAGNOSIS — F321 Major depressive disorder, single episode, moderate: Secondary | ICD-10-CM

## 2024-02-14 DIAGNOSIS — Z79899 Other long term (current) drug therapy: Secondary | ICD-10-CM

## 2024-02-14 DIAGNOSIS — F909 Attention-deficit hyperactivity disorder, unspecified type: Secondary | ICD-10-CM | POA: Insufficient documentation

## 2024-02-14 DIAGNOSIS — F900 Attention-deficit hyperactivity disorder, predominantly inattentive type: Secondary | ICD-10-CM | POA: Diagnosis not present

## 2024-02-14 MED ORDER — BUPROPION HCL ER (XL) 300 MG PO TB24: 300.0000 mg | ORAL_TABLET | Freq: Every day | ORAL | 1 refills | Status: DC

## 2024-02-14 MED ORDER — AMPHETAMINE-DEXTROAMPHETAMINE 15 MG PO TABS: 15.0000 mg | ORAL_TABLET | Freq: Every day | ORAL | 0 refills | Status: DC

## 2024-02-14 NOTE — Addendum Note (Signed)
 Addended by: CARVIN CROCK on: 02/14/2024 03:56 PM   Modules accepted: Level of Service

## 2024-02-15 LAB — MED LIST OPTION NOT SELECTED

## 2024-02-17 LAB — TOXASSURE SELECT 13 (MW), URINE

## 2024-02-17 LAB — SPECIMEN STATUS REPORT

## 2024-03-05 NOTE — Progress Notes (Signed)
 BH MD Outpatient Progress Note  03/13/2024 1:47 PM Thomas Kelly  MRN:  [id number]  Assessment:  Thomas Kelly presents for follow-up evaluation in-person. We restarted his home Adderall and continued Wellbutrin . 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 He recently learned that his father has cancer in which he is feeling more depressed and is getting support from his wife and therapist during this time period. He is also trying to journal his day and feelings every day. He feels that the depression is manageable at this time, denying SI and understands to discuss if depressive symptoms do worsen. He states Adderall is helping him with finishing tasks but symptoms of inattention may further improve when he has a more structured schedule. BP has been elevated, will have to closely monitor as Adderall and Wellbutrin  can increase BP. No medication changes today, will f/u in 2.5 months.   Of note, patient's BP was 144/103 and then 143/100 at the end of the visit. Patient denies symptoms of chest pain, SOB, and blurry vision. Pt was instructed to discuss high BP reading with their PCP and if those symptoms arose and worsened with the elevated BP to go to the ED. He plans on monitoring his home BP twice daily to assess if his BP are also elevated    Identifying Information: Thomas Kelly is a 53 y.o. y.o. male with a history of major depressive disorder, single episode moderate who is an established patient with [facility] for management of depression and anxiety.   Plan:  # Major depressive disorder, recurrent, moderate # Generalized anxiety disorder -- Continue Wellbutrin  300 mg XL daily  # ADHD, inattentive type Past medication trials: Vyvanse (increased depression) -- Continue Adderall 15 mg daily (started 09/2023)  -- [facility] [place]  testing completed in February 2025,  results in the media tab  -- Previously was taking 15 mg on weekdays since March  -- Rapid UDS negative  -- PDMP reviewed, appropriately filling -- Wellbutrin  as above   Patient was given contact information for behavioral health clinic and was instructed to call 911 for emergencies.   Subjective:  Chief Complaint:  Chief Complaint  Patient presents with   Follow-up   Medication Refill    Interval History:   Patient seen alone.  Patient reports feeling okay today. Since the previous visit, he reports its the beginning of the semester of working at [employer] so he feels more stressed with the transition. He reports coping through prioritization and working with therapist with setting goals and reflection. He is wanting to work on this journal daily. Regarding medications, patient has been taking his Adderall on weekdays. He notes more variability with his day so he has some difficulty seeing how Adderall is helping but he does report finishing his tasks. Patient reports the following adverse effects: denies. He reports having depression but states at this time it is manageable.   Patient reports good sleep, reporting 6.5 hours nightly. Patient reports good appetite, eating 3 meals daily.   Patient denies current SI, HI, and AVH.   Stressors include school semester starting up, father terminally ill found out 1 month ago- stage 4 prostate cancer. He talks to his wife about this.    Visit Diagnosis:    ICD-10-CM   1. Attention deficit hyperactivity disorder (ADHD), predominantly inattentive type  F90.0 amphetamine -dextroamphetamine  (ADDERALL) 15 MG tablet    amphetamine -dextroamphetamine  (  ADDERALL) 15 MG tablet    amphetamine -dextroamphetamine  (ADDERALL) 15 MG tablet    2. Moderate episode of recurrent major depressive disorder (HCC)  F33.1     3. Major depressive disorder, single episode, moderate (HCC)  F32.1 buPROPion  (WELLBUTRIN  XL) 300 MG 24 hr tablet       Past  Psychiatric History:  Dx: MDD, ADHD (dx last year) Medications: Wellbutrin  and Buspar , tried Vyvanse (short amount of time of trial- states it made him feel depressed), Prozac (low libido) Previously saw Dr. Marry in 01/2023 Currently seeing a therapist  Thomas Kelly  Substance use: denies illicit substance use, denies tobacco use, reports varying amounts of alcohol use- few drinks in the weekend  Family Psychiatric History: none pertinent  Social History:  Living: returned to [facility] in June 2025, stayed in [place]  last year Occupation: professor Relationship: married, wife is a Paramedic Children: 5 children Support: wife, friends  Past Medical History:  Past Medical History:  Diagnosis Date   Anxiety    Depression    GERD (gastroesophageal reflux disease)     Past Surgical History:  Procedure Laterality Date   VASECTOMY  2014    \ Family History:  Family History  Problem Relation Age of Onset   ADD / ADHD Son    Bipolar disorder Son    Alcohol abuse Neg Hx    Anxiety disorder Neg Hx    Dementia Neg Hx    Depression Neg Hx    Drug abuse Neg Hx    Schizophrenia Neg Hx    Suicidality Neg Hx     Social History   Socioeconomic History   Marital status: Media planner    Spouse name: Not on file   Number of children: Not on file   Years of education: Not on file   Highest education level: Not on file  Occupational History   Not on file  Tobacco Use   Smoking status: Former    Current packs/day: 0.00    Types: Cigarettes    Quit date: 11/29/2007    Years since quitting: 16.2   Smokeless tobacco: Never  Vaping Use   Vaping status: Never Used  Substance and Sexual Activity   Alcohol use: Yes    Alcohol/week: 6.0 standard drinks of alcohol    Types: 6 Shots of liquor per week    Comment: a few times a week   Drug use: No   Sexual activity: Yes    Partners: Female    Birth control/protection: None  Other Topics Concern   Not on file  Social  History Narrative   Not on file   Social Drivers of Health   Financial Resource Strain: Not on file  Food Insecurity: Not on file  Transportation Needs: Not on file  Physical Activity: Not on file  Stress: Not on file  Social Connections: Not on file    Allergies: No Known Allergies  Current Medications: Current Outpatient Medications  Medication Sig Dispense Refill   amphetamine -dextroamphetamine  (ADDERALL) 15 MG tablet Take 1 tablet by mouth daily. 30 tablet 0   [START ON 05/13/2024] amphetamine -dextroamphetamine  (ADDERALL) 15 MG tablet Take 1 tablet by mouth daily. 30 tablet 0   [START ON 04/13/2024] amphetamine -dextroamphetamine  (ADDERALL) 15 MG tablet Take 1 tablet by mouth daily. 30 tablet 0   buPROPion  (WELLBUTRIN  XL) 300 MG 24 hr tablet Take 1 tablet (300 mg total) by mouth daily. 72 tablet 0   Multiple Vitamin (MULTIVITAMIN) capsule Take 1 capsule by mouth daily.  Omeprazole 20 MG TBEC Take 20-40 mg by mouth daily.     No current facility-administered medications for this visit.     Objective: Psychiatric Specialty Exam: General Appearance: appears at stated age, casually dressed and groomed   Behavior: pleasant and cooperative   Psychomotor Activity: no psychomotor agitation or retardation noted   Eye Contact: fair  Speech: normal amount, volume and fluency    Mood: euthymic  Affect: congruent, pleasant and interactive   Thought Process: linear, goal directed, no circumstantial or tangential thought process noted, no racing thoughts or flight of ideas  Descriptions of Associations: intact   Thought Content Hallucinations: denies AH, VH , does not appear responding to stimuli  Delusions: no paranoia, delusions of control, grandeur, ideas of reference, thought broadcasting, and magical thinking  Suicidal Thoughts: denies SI, intention, plan  Homicidal Thoughts: denies HI, intention, plan   Alertness/Orientation: alert and fully oriented   Insight:  fair Judgment: fair  Memory: intact   Executive Functions  Concentration: intact  Attention Span: fair  Recall: intact  Fund of Knowledge: fair   Physical Exam  General: Pleasant, well-appearing. No acute distress. Pulmonary: Normal effort. No wheezing or rales. Skin: No obvious rash or lesions. Neuro: A&Ox3.No focal deficit.  Review of Systems  No reported symptoms   Metabolic Disorder Labs: No results found for: HGBA1C, MPG No results found for: PROLACTIN No results found for: CHOL, TRIG, HDL, CHOLHDL, VLDL, LDLCALC No results found for: TSH  Therapeutic Level Labs: No results found for: LITHIUM No results found for: VALPROATE No results found for: CBMZ  Screenings: PHQ2-9    Flowsheet Row Video Visit from [facility]/15/2022 in [facility] Video Visit from 03[facility]24/2022 in [facility] Video Visit from 10[facility]21/2021 in [facility] Video Visit from 06[facility]24/2021 in [facility] Video Visit from 04/15/2021 in [facility]  PHQ-2 Total Score 1 0 1 1 0  PHQ-9 Total Score 1 -- -- -- --   Flowsheet Row Video Visit from 09/15/2022 in [facility] Video Visit from 03/24/2022 in [facility] Video Visit from 10/21/2021 in [facility]  C-SSRS RISK CATEGORY No Risk No Risk No Risk    Thomas Franco, MD PGY-3 Psychiatry Resident

## 2024-03-13 ENCOUNTER — Ambulatory Visit (HOSPITAL_COMMUNITY): Admitting: Psychiatry

## 2024-03-13 VITALS — BP 143/100 | Ht 70.0 in | Wt 210.8 lb

## 2024-03-13 DIAGNOSIS — F331 Major depressive disorder, recurrent, moderate: Secondary | ICD-10-CM

## 2024-03-13 DIAGNOSIS — F900 Attention-deficit hyperactivity disorder, predominantly inattentive type: Secondary | ICD-10-CM | POA: Diagnosis not present

## 2024-03-13 DIAGNOSIS — F321 Major depressive disorder, single episode, moderate: Secondary | ICD-10-CM

## 2024-03-13 DIAGNOSIS — F339 Major depressive disorder, recurrent, unspecified: Secondary | ICD-10-CM | POA: Insufficient documentation

## 2024-03-13 MED ORDER — AMPHETAMINE-DEXTROAMPHETAMINE 15 MG PO TABS: 15.0000 mg | ORAL_TABLET | Freq: Every day | ORAL | 0 refills | Status: DC

## 2024-03-13 MED ORDER — BUPROPION HCL ER (XL) 300 MG PO TB24: 300.0000 mg | ORAL_TABLET | Freq: Every day | ORAL | 0 refills | Status: DC

## 2024-03-13 NOTE — Addendum Note (Signed)
 Addended by: CARVIN CROCK on: 03/13/2024 04:34 PM   Modules accepted: Level of Service

## 2024-05-13 ENCOUNTER — Telehealth (HOSPITAL_COMMUNITY): Payer: Self-pay | Admitting: *Deleted

## 2024-05-13 NOTE — Telephone Encounter (Signed)
Pt is requesting refill of Adderall.

## 2024-05-13 NOTE — Progress Notes (Addendum)
 BH MD Outpatient Progress Note  05/22/2024 8:49 AM Thomas Kelly  MRN:  [id number]  Assessment:  Thomas Kelly presents for follow-up evaluation. Patient appears to be doing well, managing his psychosocial stressors appropriately with his therapist along with the help of his medications.  Reassuringly, he sees his therapist weekly which he feels helps him prioritize his busy schedule.  Has been tolerating the Adderall well.  Discussed starting as needed propranolol to aid with breakthrough anxiety. Continue current Wellbutrin  and Adderall dose. Will follow-up in 6 weeks  Of note, patient's BP was 140/90 today. He showed his BP log from home which ranged from 100's-130's/70s-80's. Patient does ride a motorcycle to work which may explain his transient elevated BP. Will continue to monitor.   Identifying Information: Thomas Kelly is a 53 y.o. male with a history of major depressive disorder and ADHD who is an established patient with [facility] for management of depression and anxiety.   Plan:  # Major depressive disorder, recurrent, moderate -- Continue Wellbutrin  300 mg XL daily  # Generalized anxiety disorder -- Start propranolol 10 mg TID PRN  # ADHD, inattentive type Past medication trials: Vyvanse (increased depression) -- Continue Adderall 15 mg daily (started 09/2023)  -- [place] [place]  testing completed in February 2025, results in the media tab  -- UDS 01/2024 negative  -- PDMP reviewed, appropriately filling- most recent fill 10/27 and set to run out 11/26 -- Wellbutrin  as above  Patient was given contact information for behavioral health clinic and was instructed to call 911 for emergencies.   Subjective:  Chief Complaint: med mgmt  Interval History:   Patient seen alone.  Patient reports feeling good today. Since the previous visit, he states feeling some stress and he sees a therapist weekly and analyzed the behavioral changes that he can make when he is  stressed. Stressors include over committing at work. He notes having worry at night at times because of the worry, He has been going through each of the big projects and prioritizing and organizing aspects of the projects that would be causing him stress.   Regarding psychiatric symptoms, he felt like Adderall was helpful. He states running out for some time and now that he is back on the medication, his focus is more improved.  Patient reports the following adverse effects: denies. Discussed the benefits and possible AE of starting propranolol and he is agreeable to start.  Denied cocaine use and medical history of asthma.  Patient reports fair sleep. Patient reports good appetite.   Patient denies current SI, HI, and AVH.   Past Psychiatric History:  Dx: MDD, ADHD (dx last year) Medications: Wellbutrin  and Buspar , tried Vyvanse (short amount of time of trial- states it made him feel depressed), Prozac (low libido), Xanax  (did not like side effects) Previously saw Dr. Marry in 01/2023 Currently seeing a therapist  Thomas Kelly  Substance use: denies illicit substance use, denies tobacco use, reports varying amounts of alcohol use- few drinks in the weekend  Family Psychiatric History: none pertinent  Social History:  Living: returned to [place] in June 2025, stayed in [place]  last year Occupation: professor Relationship: married, wife is a paramedic Children: 5 children Support: wife, friends  Past Medical History:  Past Medical History:  Diagnosis Date   Anxiety    Depression    GERD (gastroesophageal reflux disease)     Past Surgical History:  Procedure Laterality Date   VASECTOMY  2014   Family History:  Family History  Problem Relation Age of Onset   ADD / ADHD Son    Bipolar disorder Son    Alcohol abuse Neg Hx    Anxiety disorder Neg Hx    Dementia Neg Hx    Depression Neg Hx    Drug abuse Neg Hx    Schizophrenia Neg Hx    Suicidality Neg Hx     Social  History   Socioeconomic History   Marital status: Media Planner    Spouse name: Not on file   Number of children: Not on file   Years of education: Not on file   Highest education level: Not on file  Occupational History   Not on file  Tobacco Use   Smoking status: Former    Current packs/day: 0.00    Types: Cigarettes    Quit date: 11/29/2007    Years since quitting: 16.4   Smokeless tobacco: Never  Vaping Use   Vaping status: Never Used  Substance and Sexual Activity   Alcohol use: Yes    Alcohol/week: 6.0 standard drinks of alcohol    Types: 6 Shots of liquor per week    Comment: a few times a week   Drug use: No   Sexual activity: Yes    Partners: Female    Birth control/protection: None  Other Topics Concern   Not on file  Social History Narrative   Not on file   Social Drivers of Health   Financial Resource Strain: Not on file  Food Insecurity: Not on file  Transportation Needs: Not on file  Physical Activity: Not on file  Stress: Not on file  Social Connections: Not on file    Allergies: No Known Allergies  Current Medications: Current Outpatient Medications  Medication Sig Dispense Refill   amphetamine -dextroamphetamine  (ADDERALL) 15 MG tablet Take 1 tablet by mouth daily. 30 tablet 0   amphetamine -dextroamphetamine  (ADDERALL) 15 MG tablet Take 1 tablet by mouth daily. 30 tablet 0   amphetamine -dextroamphetamine  (ADDERALL) 15 MG tablet Take 1 tablet by mouth daily. 30 tablet 0   buPROPion  (WELLBUTRIN  XL) 300 MG 24 hr tablet Take 1 tablet (300 mg total) by mouth daily. 72 tablet 0   Multiple Vitamin (MULTIVITAMIN) capsule Take 1 capsule by mouth daily.     Omeprazole 20 MG TBEC Take 20-40 mg by mouth daily.     No current facility-administered medications for this visit.     Objective: Psychiatric Specialty Exam: General Appearance: appears at stated age, casually dressed and groomed   Behavior: pleasant and cooperative   Psychomotor Activity:  no psychomotor agitation or retardation noted   Eye Contact: fair  Speech: normal amount, volume and fluency    Mood: euthymic  Affect: congruent, pleasant and interactive   Thought Process: linear, goal directed, no circumstantial or tangential thought process noted, no racing thoughts or flight of ideas  Descriptions of Associations: intact   Thought Content Hallucinations: denies AH, VH , does not appear responding to stimuli  Delusions: no paranoia, delusions of control, grandeur, ideas of reference, thought broadcasting, and magical thinking  Suicidal Thoughts: denies SI, intention, plan  Homicidal Thoughts: denies HI, intention, plan   Alertness/Orientation: alert and fully oriented   Insight: fair Judgment: fair  Memory: intact   Executive Functions  Concentration: intact  Attention Span: fair  Recall: intact  Fund of Knowledge: fair   Physical Exam  General: Pleasant, well-appearing . No acute distress. Pulmonary: Normal effort. No wheezing or rales. Skin: No obvious rash or lesions. Neuro:  A&Ox3.No focal deficit.  Review of Systems  No reported symptoms  Metabolic Disorder Labs: No results found for: HGBA1C, MPG No results found for: PROLACTIN No results found for: CHOL, TRIG, HDL, CHOLHDL, VLDL, LDLCALC No results found for: TSH  Therapeutic Level Labs: No results found for: LITHIUM No results found for: VALPROATE No results found for: CBMZ  Screenings: PHQ2-9    Flowsheet Row Video [facility] Video Visit from 03/24/2022 in [facility] Video Visit from 10[facility]21/2021 in [facility] Video Visit from 06/24/2021 in [facility] Video Visit from 04/15/2021 in [facility]  PHQ-2 Total Score 1 0 1 1 0  PHQ-9 Total Score 1  -- -- -- --   Flowsheet Row Video Visit from 09/15/2022 in [facility] Video Visit from 03[facility]24/2022 in [facility] Video Visit from 10/21/2021 in [facility]  C-SSRS RISK CATEGORY No Risk No Risk No Risk    Ismael Franco, MD PGY-3 Psychiatry Resident

## 2024-05-22 ENCOUNTER — Ambulatory Visit (HOSPITAL_COMMUNITY): Admitting: Psychiatry

## 2024-05-22 VITALS — BP 140/90 | Wt 216.2 lb

## 2024-05-22 DIAGNOSIS — F411 Generalized anxiety disorder: Secondary | ICD-10-CM | POA: Insufficient documentation

## 2024-05-22 DIAGNOSIS — F331 Major depressive disorder, recurrent, moderate: Secondary | ICD-10-CM | POA: Diagnosis not present

## 2024-05-22 DIAGNOSIS — F900 Attention-deficit hyperactivity disorder, predominantly inattentive type: Secondary | ICD-10-CM | POA: Diagnosis not present

## 2024-05-22 DIAGNOSIS — F321 Major depressive disorder, single episode, moderate: Secondary | ICD-10-CM

## 2024-05-22 MED ORDER — PROPRANOLOL HCL 10 MG PO TABS: 10.0000 mg | ORAL_TABLET | Freq: Two times a day (BID) | ORAL | 0 refills | Status: AC | PRN

## 2024-05-22 MED ORDER — BUPROPION HCL ER (XL) 300 MG PO TB24: 300.0000 mg | ORAL_TABLET | Freq: Every day | ORAL | 0 refills | Status: DC

## 2024-05-22 MED ORDER — AMPHETAMINE-DEXTROAMPHETAMINE 15 MG PO TABS: 15.0000 mg | ORAL_TABLET | Freq: Every day | ORAL | 0 refills | Status: DC

## 2024-05-22 NOTE — Patient Instructions (Signed)
 Thank you for attending your appointment today.  -- start as needed propranolol 10 mg up to 3 times a day for anxiety -- Continue other medications as prescribed.  Please do not make any changes to medications without first discussing with your provider. If you are experiencing a psychiatric emergency, please call 911 or present to your nearest emergency department. Additional crisis, medication management, and therapy resources are included below.  [facility]  [address] [place], [place] [address] [phone] WALK-IN URGENT CARE 24/7 FOR ANYONE [address], [place], [place]  [phone] Fax: [phone] [email or web address] *Interpreters available *Accepts all insurance and uninsured for Urgent Care needs *Accepts Medicaid and uninsured for outpatient treatment (below)      ONLY FOR [facility]  Below:    Outpatient New Patient Assessment/Therapy Walk-ins:        Monday, Wednesday, and Thursday 8am until slots are full (first come, first served)                   New Patient Psychiatry/Medication Management        Monday-Friday 8am-11am (first come, first served)               For all walk-ins we ask that you arrive by 7:15am, because patients will be seen in the order of arrival.

## 2024-07-22 ENCOUNTER — Ambulatory Visit (HOSPITAL_COMMUNITY): Admitting: Psychiatry

## 2024-08-05 NOTE — Progress Notes (Signed)
 BH MD Outpatient Progress Note Televisit via video: I connected with Thomas Kelly on 1/26 at  3:00 PM EST by a video enabled telemedicine application and verified that I am speaking with the correct person using two identifiers.  Location: Patient: outdoors  Provider: home office   I discussed the limitations of evaluation and management by telemedicine and the availability of in person appointments. The patient expressed understanding and agreed to proceed.  I discussed the assessment and treatment plan with the patient. The patient was provided an opportunity to ask questions and all were answered. The patient agreed with the plan and demonstrated an understanding of the instructions.   The patient was advised to call back or seek an in-person evaluation if the symptoms worsen or if the condition fails to improve as anticipated.   08/05/2024 9:00 AM Thomas Kelly  MRN:  [id number]  Assessment:  Thomas Kelly presents for follow-up evaluation. In the prior visit, propranolol  PRN was started for anxiety. Today, patient presents with stable symptoms of depression in the context of a recent family death. Reassuringly, he has a strong support system with his wife and his therapist whom he regularly sees. He finds benefit with his psychotropic medications regarding his symptoms of depression. He shows insight with providing coping strategies during this time of grief like maintaining a routine and spending time with his family. There is not a safety risk at this time. We will maintain his psychotropic medication regimen at this time. In the next appt, will reassess symptoms of depressive and if any neurovegetative symptoms emerge.  Identifying Information: Thomas Kelly is a 54 y.o. male with a history of major depressive disorder and ADHD who is an established patient with [facility] for management of depression and anxiety.   Plan:  # Major depressive disorder, recurrent,  moderate -- Continue Wellbutrin  300 mg XL daily -- Therapy with [name]  # Generalized anxiety disorder -- Continue Propranolol  10 mg TID PRN  -- Denies cocaine use and asthma  # ADHD, inattentive type Past medication trials: Vyvanse (increased depression) -- Continue Adderall 15 mg daily (started 09/2023)  -- [facility] [place]  testing completed in February 2025, results in the media tab  -- UDS 01/2024 negative  -- PDMP reviewed, appropriately filling -- Wellbutrin  as above  Patient was given contact information for behavioral health clinic and was instructed to call 911 for emergencies.   Subjective:  Chief Complaint: med mgmt  Interval History:   Patient reports feeling okay today.   Regarding depression, he reports feeling sad regarding his father's death and he is leaning into his support system and taking to his therapist about it which he feels is helping. He reports his father passed away from prostate cancer in December. He feels he feels its not too big of a tragedy as he was able to spend time with his dad prior to his passing. Patient reports the medications are helping with his symptoms of depression and focus. He states trying propranolol  once during the memorial service and he was not sure if there was much effect but otherwise he denies having to use it a different time. Patient denies adverse effects from his medication.  Patient reports difficulty sleeping, reporting weird dreams since his father's death and feels this is slowly improving. Patient reports good appetite. He states he is working with his therapist regarding his feelings of listlessness and discussing coping strategies like establishing a routine and staying organized.   Patient denies current SI,  HI, and AVH.   Past Psychiatric History:  Dx: MDD, ADHD (dx last year) Medications: Wellbutrin  and Buspar , tried Vyvanse (short amount of time of trial- states it made him feel depressed), Prozac (low  libido), Xanax  (did not like side effects) Previously saw Dr. Marry in 01/2023 Currently seeing a therapist [name]  Substance use: denies illicit substance use denies tobacco use reports varying amounts of alcohol use- few drinks in the weekend  Family Psychiatric History: none pertinent  Social History:  Living: returned to [facility] in June 2025, stayed in [place]  last year Occupation: professor Relationship: married, wife is a paramedic Children: 5 children Support: wife, friends  Past Medical History:  Past Medical History:  Diagnosis Date   Anxiety    Depression    GERD (gastroesophageal reflux disease)     Past Surgical History:  Procedure Laterality Date   VASECTOMY  2014   Family History:  Family History  Problem Relation Age of Onset   ADD / ADHD Son    Bipolar disorder Son    Alcohol abuse Neg Hx    Anxiety disorder Neg Hx    Dementia Neg Hx    Depression Neg Hx    Drug abuse Neg Hx    Schizophrenia Neg Hx    Suicidality Neg Hx     Social History   Socioeconomic History   Marital status: Media Planner    Spouse name: Not on file   Number of children: Not on file   Years of education: Not on file   Highest education level: Not on file  Occupational History   Not on file  Tobacco Use   Smoking status: Former    Current packs/day: 0.00    Types: Cigarettes    Quit date: 11/29/2007    Years since quitting: 16.6   Smokeless tobacco: Never  Vaping Use   Vaping status: Never Used  Substance and Sexual Activity   Alcohol use: Yes    Alcohol/week: 6.0 standard drinks of alcohol    Types: 6 Shots of liquor per week    Comment: a few times a week   Drug use: No   Sexual activity: Yes    Partners: Female    Birth control/protection: None  Other Topics Concern   Not on file  Social History Narrative   Not on file   Social Drivers of Health   Tobacco Use: Medium Risk (02/14/2024)   Patient History    Smoking Tobacco Use: Former     Smokeless Tobacco Use: Never    Passive Exposure: Not on Actuary Strain: Not on file  Food Insecurity: Not on file  Transportation Needs: Not on file  Physical Activity: Not on file  Stress: Not on file  Social Connections: Not on file  Depression (PHQ2-9): Low Risk (09/15/2022)   Depression (PHQ2-9)    PHQ-2 Score: 1  Alcohol Screen: Not on file  Housing: Not on file  Utilities: Not on file  Health Literacy: Not on file    Allergies: No Known Allergies  Current Medications: Current Outpatient Medications  Medication Sig Dispense Refill   amphetamine -dextroamphetamine  (ADDERALL) 15 MG tablet Take 1 tablet by mouth daily. 30 tablet 0   amphetamine -dextroamphetamine  (ADDERALL) 15 MG tablet Take 1 tablet by mouth daily. 30 tablet 0   buPROPion  (WELLBUTRIN  XL) 300 MG 24 hr tablet Take 1 tablet (300 mg total) by mouth daily. 60 tablet 0   Multiple Vitamin (MULTIVITAMIN) capsule Take 1 capsule by mouth daily.  Omeprazole 20 MG TBEC Take 20-40 mg by mouth daily.     propranolol  (INDERAL ) 10 MG tablet Take 1 tablet (10 mg total) by mouth 2 (two) times daily as needed (anxiety, panic attacks, sleep). Take 30 minutes before anxiety inducing event 60 tablet 0   No current facility-administered medications for this visit.     Objective:  Psychiatric Specialty Exam: General Appearance: appears at stated age, casually dressed and groomed   Behavior: pleasant and cooperative   Psychomotor Activity: no psychomotor agitation or retardation noted   Eye Contact: fair  Speech: normal amount, volume and fluency    Mood: euthymic  Affect: congruent, pleasant and interactive   Thought Process: linear, goal directed, no circumstantial or tangential thought process noted, no racing thoughts or flight of ideas  Descriptions of Associations: intact   Thought Content Hallucinations: denies AH, VH , does not appear responding to stimuli  Delusions: no paranoia, delusions  of control, grandeur, ideas of reference, thought broadcasting, and magical thinking  Suicidal Thoughts: denies SI, intention, plan  Homicidal Thoughts: denies HI, intention, plan   Alertness/Orientation: alert and fully oriented   Insight: fair Judgment: fair  Memory: intact   Executive Functions  Concentration: intact  Attention Span: fair  Recall: intact  Fund of Knowledge: fair   Physical Exam  General: Pleasant, well-appearing . No acute distress. Pulmonary: Normal effort. No wheezing or rales. Neuro: A&Ox3.No focal deficit.  Review of Systems  No reported symptoms  Metabolic Disorder Labs: No results found for: HGBA1C, MPG No results found for: PROLACTIN No results found for: CHOL, TRIG, HDL, CHOLHDL, VLDL, LDLCALC No results found for: TSH  Therapeutic Level Labs: No results found for: LITHIUM No results found for: VALPROATE No results found for: CBMZ  Screenings: PHQ2-9    Flowsheet Row Video Visit from 09/15/2022 in [facility] Video Visit from 03/24/2022 in [facility] Video Visit from 10[facility]21/2021 in [facility] Video Visit from 06[facility]24/2021 in [facility] Video Visit from 04/15/2021 in [facility]  PHQ-2 Total Score 1 0 1 1 0  PHQ-9 Total Score 1 -- -- -- --   Flowsheet Row Video Visit from 09/15/2022 in [facility] Video Visit from 03[facility]24/2022 in [facility] Video Visit from 10/21/2021 in [facility]  C-SSRS RISK CATEGORY No Risk No Risk No Risk   Consent: Patient/Guardian gives verbal consent for treatment and assignment of benefits for services provided during this visit.  I answered their questions and concerns and Patient/Guardian  expressed understanding and agreed to proceed.   Ismael Franco, MD PGY-3 Psychiatry Resident

## 2024-08-12 ENCOUNTER — Telehealth (HOSPITAL_COMMUNITY): Admitting: Psychiatry

## 2024-08-12 DIAGNOSIS — F900 Attention-deficit hyperactivity disorder, predominantly inattentive type: Secondary | ICD-10-CM

## 2024-08-12 DIAGNOSIS — F321 Major depressive disorder, single episode, moderate: Secondary | ICD-10-CM

## 2024-08-12 DIAGNOSIS — F331 Major depressive disorder, recurrent, moderate: Secondary | ICD-10-CM

## 2024-08-12 DIAGNOSIS — F411 Generalized anxiety disorder: Secondary | ICD-10-CM | POA: Diagnosis not present

## 2024-08-12 MED ORDER — AMPHETAMINE-DEXTROAMPHETAMINE 15 MG PO TABS: 15.0000 mg | ORAL_TABLET | Freq: Every day | ORAL | 0 refills | Status: AC

## 2024-08-12 MED ORDER — BUPROPION HCL ER (XL) 300 MG PO TB24: 300.0000 mg | ORAL_TABLET | Freq: Every day | ORAL | 0 refills | Status: AC

## 2024-08-14 NOTE — Addendum Note (Signed)
 Addended by: CARVIN CROCK on: 08/14/2024 08:06 AM   Modules accepted: Level of Service

## 2024-10-23 ENCOUNTER — Telehealth (HOSPITAL_COMMUNITY): Admitting: Psychiatry

## 2024-11-06 ENCOUNTER — Telehealth (HOSPITAL_COMMUNITY): Admitting: Psychiatry
